# Patient Record
Sex: Female | Born: 1950 | Race: White | Hispanic: No | Marital: Married | Smoking: Never smoker
Health system: Southern US, Community
[De-identification: ages and names within clinical notes are randomized; demographics above are authoritative.]

## PROBLEM LIST (undated history)

## (undated) DIAGNOSIS — M8580 Other specified disorders of bone density and structure, unspecified site: Secondary | ICD-10-CM

## (undated) DIAGNOSIS — J302 Other seasonal allergic rhinitis: Secondary | ICD-10-CM

## (undated) DIAGNOSIS — I10 Essential (primary) hypertension: Secondary | ICD-10-CM

## (undated) DIAGNOSIS — G4730 Sleep apnea, unspecified: Secondary | ICD-10-CM

## (undated) DIAGNOSIS — E039 Hypothyroidism, unspecified: Secondary | ICD-10-CM

## (undated) HISTORY — DX: Other specified disorders of bone density and structure, unspecified site: M85.80

## (undated) HISTORY — DX: Sleep apnea, unspecified: G47.30

## (undated) HISTORY — PX: HEMORRHOID SURGERY: SHX153

## (undated) HISTORY — DX: Essential (primary) hypertension: I10

## (undated) HISTORY — DX: Hypercalcemia: E83.52

## (undated) HISTORY — PX: RETINAL DETACHMENT SURGERY: SHX105

## (undated) HISTORY — DX: Other seasonal allergic rhinitis: J30.2

## (undated) HISTORY — PX: PARATHYROID EXPLORATION: SHX732

## (undated) HISTORY — PX: NASAL SEPTUM SURGERY: SHX37

## (undated) HISTORY — PX: CATARACT EXTRACTION: SUR2

## (undated) HISTORY — DX: Hypothyroidism, unspecified: E03.9

## (undated) HISTORY — PX: TONSILLECTOMY AND ADENOIDECTOMY: SHX28

---

## 2006-12-12 ENCOUNTER — Ambulatory Visit: Payer: Self-pay | Admitting: Internal Medicine

## 2006-12-25 ENCOUNTER — Ambulatory Visit (HOSPITAL_COMMUNITY): Admission: RE | Admit: 2006-12-25 | Discharge: 2006-12-26 | Payer: Self-pay | Admitting: Ophthalmology

## 2008-01-06 DIAGNOSIS — G4733 Obstructive sleep apnea (adult) (pediatric): Secondary | ICD-10-CM | POA: Insufficient documentation

## 2008-01-06 DIAGNOSIS — J4520 Mild intermittent asthma, uncomplicated: Secondary | ICD-10-CM | POA: Insufficient documentation

## 2008-01-06 DIAGNOSIS — J3089 Other allergic rhinitis: Secondary | ICD-10-CM

## 2008-01-06 DIAGNOSIS — J302 Other seasonal allergic rhinitis: Secondary | ICD-10-CM | POA: Insufficient documentation

## 2008-01-07 ENCOUNTER — Ambulatory Visit: Payer: Self-pay | Admitting: Internal Medicine

## 2008-01-14 ENCOUNTER — Encounter: Payer: Self-pay | Admitting: Internal Medicine

## 2008-11-13 ENCOUNTER — Telehealth: Payer: Self-pay | Admitting: Internal Medicine

## 2008-11-17 ENCOUNTER — Encounter: Admission: RE | Admit: 2008-11-17 | Discharge: 2008-11-17 | Payer: Self-pay | Admitting: General Surgery

## 2008-12-30 ENCOUNTER — Ambulatory Visit: Payer: Self-pay | Admitting: Internal Medicine

## 2009-01-08 ENCOUNTER — Telehealth: Payer: Self-pay | Admitting: Internal Medicine

## 2009-01-14 ENCOUNTER — Encounter: Payer: Self-pay | Admitting: Internal Medicine

## 2009-12-29 ENCOUNTER — Ambulatory Visit: Payer: Self-pay | Admitting: Internal Medicine

## 2009-12-29 DIAGNOSIS — E039 Hypothyroidism, unspecified: Secondary | ICD-10-CM | POA: Insufficient documentation

## 2010-01-19 ENCOUNTER — Encounter: Payer: Self-pay | Admitting: Internal Medicine

## 2010-05-11 NOTE — Assessment & Plan Note (Signed)
Summary: rov 1 yr ///kp   Primary Emily Kelley/Referring Lavern Crimi:  Merri Brunette  CC:  1 year follow up visit-sleep; needs RX for CPAP supplies..  History of Present Illness:  01/07/08- Asthma, allergic rhinitis and sleep apnea. 1 yr f/u.  Sleeps well. Nocturia x 1. No day time sleepiness. Can't sleep w/out cpap 10. Declines flu vax. No asthma Nasonex used as needed  12/30/08- Asthma, allergic rhinitis, OSA Continues good compliance and control. She continues at 10 cwp. Needs replacement mask.  Her machine is 62 years old nd still works well. Allergy symptoms increasing this Fall. She has used claritin about once weekly and asks sample Nasonex. Denies cough, wheeze or chest pain. had outgrown childhood asthma and atopy.  December 29, 2009  Asthma, allergic rhinits, OSA Contines CPAP at 10 usd all night every night and she sys she sleeps well, stays wide awake during the day and is doing fine. Allergic nose doing fairly well but asks for refill fluticasone and a sample. Generally doesn't need antihistamine or decongestant as loing as she has this. Denies sinus infections.  Wants Flu vax. Asthma was severe as a child, but denies noticing it in recent years.  Dr Lisabeth Devoid has diagnosed hypothyroidism and goiter, starting treatment.    Asthma History    Initial Asthma Severity Rating:    Age range: 12+ years    Symptoms: 0-2 days/week    Nighttime Awakenings: 0-2/month    Interferes w/ normal activity: no limitations    SABA use (not for EIB): 0-2 days/week    Asthma Severity Assessment: Intermittent   Preventive Screening-Counseling & Management  Alcohol-Tobacco     Smoking Status: never     Passive Smoke Exposure: no  Current Medications (verified): 1)  Hormone Cream .... Apply Daily 2)  Cpap 10 Cwp .... [facility] 3)  Fluticasone Propionate 50 Mcg/act Susp (Fluticasone Propionate) .Marland Kitchen.. 1-2 Puffs Each Nostril Once Every Day 4)  Armour Thyroid 15 Mg Tabs  (Thyroid) .... Take 1 By Mouth Once Daily  Allergies (verified): No Known Drug Allergies  Past History:  Family History: Last updated: 12/30/2008 Mother-living age 49; DM, dementia Father- deceased age 50; Parkinson's disease Sibling 1- living age 69  Social History: Last updated: 01/24/2008 Patient never smoked.  Negative history of passive tobacco smoke exposure.  No ETOH Married with no children   Risk Factors: Smoking Status: never (12/29/2009) Passive Smoke Exposure: no (12/29/2009)  Past Medical History: SLEEP APNEA (ICD-780.57) NPSG 01/14/99 AHI 14 ASTHMA, CHILDHOOD (ICD-493.00) ALLERGIC RHINITIS, SEASONAL (ICD-477.0) Hypothyroidism  Past Surgical History: Tonsils and & Adenoids Deviated septum repair in 1973 Hemorhoid surgery x 2 polyp  Review of Systems      See HPI  The patient denies shortness of breath with activity, shortness of breath at rest, productive cough, non-productive cough, coughing up blood, chest pain, irregular heartbeats, acid heartburn, indigestion, loss of appetite, weight change, abdominal pain, difficulty swallowing, sore throat, tooth/dental problems, headaches, nasal congestion/difficulty breathing through nose, and sneezing.    Vital Signs:  Patient profile:   60 year old female Height:      68 inches Weight:      194.13 pounds BMI:     29.62 O2 Sat:      96 % on Room air Pulse rate:   83 / minute BP sitting:   144 / 86  (right arm) Cuff size:   regular  Vitals Entered By: Reynaldo Minium CMA (December 29, 2009 9:04 AM)  O2 Flow:  Room  air CC: 1 year follow up visit-sleep; needs RX for CPAP supplies.   Physical Exam  Additional Exam:  General: A/Ox3; pleasant and cooperative, NAD, talkative SKIN: no rash, lesions NODES: no lymphadenopathy HEENT: [place]/AT, EOM- WNL, Conjuctivae- clear. bags under eyes, PERRLA, TM-WNL, Nose- stuffy, especially on the right, septal deviation, Throat- clear and wnl, Mallampati III NECK: Supple w/  fair ROM, JVD- none, normal carotid impulses w/o bruits Thyroid- ? small goiter CHEST: Clear to P&A HEART: RRR, no m/g/r heard ABDOMEN: Soft and nl;  ZOX:WRUE, nl pulses, no edema  NEURO: Grossly intact to observation       Impression & Recommendations:  Problem # 1:  SLEEP APNEA (ICD-780.57)  Doing  very well with good compliance and control on cpap at 10. We are scripting for replacement cpaqp supplies.  Orders: Est. Patient Level IV (45409) DME Referral (DME)  Problem # 2:  ALLERGIC RHINITIS, SEASONAL (ICD-477.0)  She is getting more congested with the seasonal pollens since she has't been using her nasal spray. We will get that refilled and we did discuss otc options.  Orders: Est. Patient Level IV (81191)  Problem # 3:  ASTHMA, CHILDHOOD (ICD-493.00) Mild asthma has not been clinically evident in a long time. We discussed the potential for reactivation. We are giveing flu vax as discussed.  Medications Added to Medication List This Visit: 1)  Replacement Cpap Supplies and Mask of [facility]  2)  Armour Thyroid 15 Mg Tabs (Thyroid) .... Take 1 by mouth once daily  Patient Instructions: 1)  Please schedule a follow-up appointment in 1 year. 2)  We will send script to DME for replacement mask and supplies, with pressure left at 10 3)  Sample nasal steroid spray with refill script for fluticasone 4)  Flu vax Prescriptions: FLUTICASONE PROPIONATE 50 MCG/ACT SUSP (FLUTICASONE PROPIONATE) 1-2 puffs each nostril once every day  #1 x prn   Entered and Authorized by:   Waymon Budge MD   Signed by:   Waymon Budge MD on 12/29/2009   Method used:   Print then Give to Patient   RxID:   [id number] REPLACEMENT CPAP SUPPLIES AND MASK OF CHOICE   #1 x prn   Entered and Authorized by:   Waymon Budge MD   Signed by:   Waymon Budge MD on 12/29/2009   Method used:   Print then Give to Patient   RxID:   [id number]

## 2010-05-11 NOTE — Medication Information (Signed)
Summary: CPAP Supplies / [facility]  CPAP Supplies / [facility]   Imported By: Lennie Odor 01/31/2010 15:03:59  _____________________________________________________________________  External Attachment:    Type:   Image     Comment:   External Document

## 2010-08-23 NOTE — Op Note (Signed)
NAMETHERESIA, PREE NO.:  [id number]   MEDICAL RECORD NO.:  [id number]          PATIENT TYPE:  OIB   LOCATION:  5706                         FACILITY:  [facility]   PHYSICIAN:  Beulah Gandy. Ashley Royalty, M.D. DATE OF BIRTH:  [birth year]   DATE OF PROCEDURE:  12/25/2006  DATE OF DISCHARGE:                               OPERATIVE REPORT   ADMISSION DIAGNOSIS:  Rhegmatogenous retinal detachment, left eye.   PROCEDURE:  Scleral buckle, left eye, retinal photocoagulation, left  eye.   SURGEON:  Beulah Gandy. Ashley Royalty, M.D.   ASSISTANT:  Rosalie Doctor, MA   ANESTHESIA:  General.   DETAIL:  Usual prep and drape, 360 degrees limbal peritomy, isolation of  four rectus muscles on 2-0 silk.  Localization of breaks at 4 o'clock.  Scleral dissection from 2 o'clock to 10 o'clock to admit a number 279  intrascleral implant.  Diathermy placed in the bed.  Additional  dissection was carried out to 4 o'clock beneath the breaks.  Two sutures  per quadrant for a total of six sutures were placed in the scleral  flaps. A 279 implant was placed against the globe in the bed.  A 508G  radial segment was fashioned to fit beneath the breaks at 4 o'clock.  Perforation site at 4 o'clock with a small amount of clear colorless  subretinal fluid which was sticky.  The buckle elements were placed and  the scleral flaps were closed.  Indirect ophthalmoscopy showed the  retina to be lying nicely on the scleral buckle with the break well  supported.  A 240 band was placed around the eye with a belt loop at 10  and 2 and a 270 sleeve at 12 o'clock.  The buckle was adjusted and  trimmed.  The band was adjusted and trimmed.  Indirect ophthalmoscope  laser was moved into place, 550 burns were placed around the retinal  periphery with a power between 400 and 600 milliwatts, 1000 microns  each, and 0.1 seconds each. The conjunctiva was reposited with 7-0  chromic suture.  Polymyxin and gentamicin were irrigated into  tenon's  space.  Atropine solution was applied. Marcaine was injected around the  globe for postop pain. Decadron 10 mg was injected into the lower  subconjunctival space. Paracentesis x2 obtained a closing pressure of 15  with a Banker.  TobraDex ointment, a patch and shield were  placed.  The patient is awakened and taken to recovery in satisfactory  condition.  Complications none.  Duration 1 hour 30 minutes.      Beulah Gandy. Ashley Royalty, M.D.  Electronically Signed     JDM/MEDQ  D:  12/25/2006  T:  12/25/2006  Job:  [id number]

## 2010-08-23 NOTE — Assessment & Plan Note (Signed)
[facility]                             PULMONARY OFFICE NOTE   Emily Kelley, Emily Kelley                    MRN:          [id number]  DATE:12/12/2006                            DOB:          [birth year]    PROBLEM:  A 60 year old woman with sleep apnea seeking to establish here  for followup.   HISTORY:  She was diagnosed in [place] 8 years ago because of husband's  complaints of her interrupted snoring and her own awareness of daytime  sleepiness.  She had had an accident in 2000, falling at work, and was  found to be hypotensive which they attributed to her sleep apnea.  A  nocturnal polysomnogram on January 14, 1999 recorded an apnea/hypopnea  index of 14.95 per hour with desaturation nadir 86%.  Events were not  clearly positional.  She also had periodic limb movement with arousal  about 8.4 times per hour.  She was titrated to CPAP at 10 CWP and has  done very well with this.  Since they moved here from [place], she has  spoken with [facility], but not established with a home care company.  She  has a GYN, but has not established a primary physician otherwise.  She  is told now that she does not snore through CPAP and she does not feel  tired.  Bedtime 10 to 11 p.m., estimating sleep latency 5 minutes,  waking zero to 1 before finally up at 6 a.m.   MEDICATIONS:  1. Hormones.  2. Fish oil.  3. Co-Q 10.   No medication allergy.   REVIEW OF SYSTEMS:  No snoring with CPAP.  Weight has gone up about 10  pounds.  She is sedentary.  She gets somewhat dyspneic climbing stairs,  but thinks this is related to her level of her fitness.  No chest pain  or palpitations, syncope, or wheeze.   PAST HISTORY:  1. Tonsils and adenoids out.  2. No history of thyroid disease.  3. Asthma only as a child.  4. Seasonal rhinitis.  She was skin test positive as a child.  5. Sleep apnea.  6. Deviated septum repair in 1973.  7. Hemorrhoid surgery x2.   SOCIAL  HISTORY:  Nonsmoker, no alcohol.  Two cups of coffee in the  morning, sometimes 1 glass of tea later.  Married, no children.  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   FAMILY HISTORY:  Nobody known to have sleep apnea, heart disease, or  stroke.  Mother is living at age 21.   OBJECTIVE:  Weight 211 pounds, BP 140/86, pulse 76, room air saturation  94%.  She looks somewhat tired, a little puffy under the eyes.  No  conjunctival injection.  Speech is clear, affect is pleasant.  Neurologic is unremarkable with observation.  HEENT:  Palate spacing 3-4/4, voice quality normal.  No postnasal  drainage.  No evident nasal obstruction, no thyromegaly.  CHEST:  Quiet, clear lung fields.  Unlabored breathing.  Heart sounds regular without murmur or gallop.  EXTREMITIES:  Without restlessness or tremor.   IMPRESSION:  1. Obstructive sleep apnea with a original index 14.9 per hour on      January 14, 1999.  Titrated to continuous positive airway pressure      of 10 CWP.  2. Allergic rhinitis.   PLAN:  1. We discussed the basics of sleep apnea.  2. Will work with her to get replacement mask and supplies as needed.      She can continue continuous positive airway pressure at 10.  She is      given a sample of Nasonex 2 sprays each nostril daily until the      sample is used up for trial.  3. Schedule return 2-3 months, earlier p.r.n.     Clinton D. Maple Hudson, MD, Tonny Bollman, FACP  Electronically Signed    CDY/MedQ  DD: 12/12/2006  DT: 12/13/2006  Job #: [id number]   cc:   Duke Salvia. Marcelle Overlie, M.D.

## 2010-12-28 ENCOUNTER — Encounter: Payer: Self-pay | Admitting: Pulmonary Disease

## 2010-12-29 ENCOUNTER — Ambulatory Visit (INDEPENDENT_AMBULATORY_CARE_PROVIDER_SITE_OTHER): Payer: 59 | Admitting: Internal Medicine

## 2010-12-29 ENCOUNTER — Encounter: Payer: Self-pay | Admitting: Internal Medicine

## 2010-12-29 VITALS — BP 140/80 | HR 74 | Ht 68.0 in | Wt 199.2 lb

## 2010-12-29 DIAGNOSIS — J301 Allergic rhinitis due to pollen: Secondary | ICD-10-CM

## 2010-12-29 DIAGNOSIS — G4730 Sleep apnea, unspecified: Secondary | ICD-10-CM

## 2010-12-29 NOTE — Patient Instructions (Signed)
Order- [facility]- replacement CPAP mask and supplies   Dx OSA  Please call as needed. Call for Nasonex script if needed for nose problems lasting more than a day or two.

## 2010-12-29 NOTE — Assessment & Plan Note (Signed)
Great compliance and control. We will refill her supplies as requested.

## 2010-12-29 NOTE — Assessment & Plan Note (Signed)
Educated on differences between Nasonex and Afrin. We wil leave Nasonex on her list as available if needed.

## 2010-12-29 NOTE — Progress Notes (Signed)
Subjective:    Patient ID: Emily Kelley, female    DOB: [birth year], 60 y.o.   MRN: [id number]  HPI 12/29/10- 59yoF followed for asthma, allergic rhinitis, OSA, complicated by hypothyroidism. Last here December 19, 2009.  Since last here she says she has been doing "great". She continues CPAP through [facility] at 10 CWP using a nasal pillows mask. She asks for a prescription for replacement of mask and supplies. Her machine mechanically works well. She uses it all night every night  " cannot live without it". She has not had recognizable asthma since childhood. Denies nasal congestion sneezing or drainage. Recognizes minor watering of her eyes occasionally and can manage this with over-the-counter antihistamines. We're need for over-the-counter decongestant which we discussed. She did not need and did not fill prescription for Nasonex last year. She was unclear about the distinction between nasal decongestant sprays and nasal steroid sprays so we spent time discussing that.   Review of Systems Constitutional:   No-   weight loss, night sweats, fevers, chills, fatigue, lassitude. HEENT:   No-  headaches, difficulty swallowing, tooth/dental problems, sore throat,       No-  sneezing, itching, ear ache, nasal congestion, post nasal drip,  CV:  No-   chest pain, orthopnea, PND, swelling in lower extremities, anasarca,  dizziness, palpitations Resp: No-   shortness of breath with exertion or at rest.              No-   productive cough,  No non-productive cough,  No-  coughing up of blood.              No-   change in color of mucus.  No- wheezing.   Skin: No-   rash or lesions. GI:  No-   heartburn, indigestion, abdominal pain, nausea, vomiting, diarrhea,                 change in bowel habits, loss of appetite GU: No-   dysuria, change in color of urine, no urgency or frequency.  No- flank pain. MS:  No-   joint pain or swelling.  No- decreased range of motion.  No- back pain.  Psych:   No- change in mood or affect. No depression or anxiety.  No memory loss.      Objective:   Physical Exam General- Alert, Oriented, Affect-appropriate, Distress- none acute Skin- rash-none, lesions- none, excoriation- none Lymphadenopathy- none Head- atraumatic            Eyes- Gross vision intact, PERRLA, conjunctivae clear secretions.+ Mild bilateral periorbitally edema.            Ears- Hearing, canals normal            Nose- Clear, no-Septal dev, mucus, polyps, erosion, perforation             Throat- Mallampati III-IV , mucosa clear , drainage- none, tonsils- atrophic Neck- flexible , trachea midline, no stridor , thyroid nl, carotid no bruit Chest - symmetrical excursion , unlabored           Heart/CV- RRR , no murmur , no gallop  , no rub, nl s1 s2                           - JVD- none , edema- none, stasis changes- none, varices- none           Lung- clear to P&A, wheeze- none, cough- none , dullness-none,  rub- none           Chest wall-  Abd- tender-no, distended-no, bowel sounds-present, HSM- no Br/ Gen/ Rectal- Not done, not indicated Extrem- cyanosis- none, clubbing, none, atrophy- none, strength- nl Neuro- grossly intact to observation          Assessment & Plan:

## 2011-01-19 LAB — CBC
HCT: 45.3
Hemoglobin: 15.7 — ABNORMAL HIGH
RBC: 5.13 — ABNORMAL HIGH
RDW: 13.1

## 2011-01-19 LAB — BASIC METABOLIC PANEL
CO2: 27
Calcium: 10.7 — ABNORMAL HIGH
GFR calc Af Amer: >60
GFR calc non Af Amer: >60
Glucose, Bld: 99
Potassium: 3.9
Sodium: 140

## 2011-01-30 ENCOUNTER — Telehealth: Payer: Self-pay | Admitting: Internal Medicine

## 2011-01-30 DIAGNOSIS — G4730 Sleep apnea, unspecified: Secondary | ICD-10-CM
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
## 2011-12-28 ENCOUNTER — Ambulatory Visit (INDEPENDENT_AMBULATORY_CARE_PROVIDER_SITE_OTHER): Payer: 59 | Admitting: Internal Medicine

## 2011-12-28 ENCOUNTER — Encounter: Payer: Self-pay | Admitting: Internal Medicine

## 2011-12-28 VITALS — BP 126/76 | HR 75 | Ht 67.5 in | Wt 190.0 lb

## 2011-12-28 DIAGNOSIS — G4733 Obstructive sleep apnea (adult) (pediatric): Secondary | ICD-10-CM

## 2011-12-28 DIAGNOSIS — J301 Allergic rhinitis due to pollen: Secondary | ICD-10-CM

## 2011-12-28 MED ORDER — AZELASTINE-FLUTICASONE 137-50 MCG/ACT NA SUSP: 2.0000 | Freq: Every day | NASAL | Status: DC

## 2011-12-28 NOTE — Progress Notes (Signed)
Subjective:    Patient ID: Emily Kelley, female    DOB: 10-Jul-1950, 61 y.o.   MRN: [id number]  HPI 12/29/10- 59yoF followed for asthma, allergic rhinitis, OSA, complicated by hypothyroidism. Last here December 19, 2009.  Since last here she says she has been doing "great". She continues CPAP through [facility] at 10 CWP using a nasal pillows mask. She asks for a prescription for replacement of mask and supplies. Her machine mechanically works well. She uses it all night every night  " cannot live without it". She has not had recognizable asthma since childhood. Denies nasal congestion sneezing or drainage. Recognizes minor watering of her eyes occasionally and can manage this with over-the-counter antihistamines. We're need for over-the-counter decongestant which we discussed. She did not need and did not fill prescription for Nasonex last year. She was unclear about the distinction between nasal decongestant sprays and nasal steroid sprays so we spent time discussing that.  12/28/11- 60 yoF followed for asthma, allergic rhinitis, OSA, complicated by hypothyroidism. Wears CPAP 10/ [facility] 8 Hrs. avg at hs, pressure. good, doing well, needs new mask, etc. Allergic rhinitis has been fairly well controlled. Sporadic use of Nasonex seasonally. Declines flu vaccine  Review of Systems- see HPI Constitutional:   No-   weight loss, night sweats, fevers, chills, fatigue, lassitude. HEENT:   No-  headaches, difficulty swallowing, tooth/dental problems, sore throat,       + sneezing, itching, ear ache, nasal congestion, post nasal drip,  CV:  No-   chest pain, orthopnea, PND, swelling in lower extremities, anasarca,  dizziness, palpitations Resp: No-   shortness of breath with exertion or at rest.              No-   productive cough,  No non-productive cough,  No-  coughing up of blood.              No-   change in color of mucus.  No- wheezing.   Skin: No-   rash or lesions. GI:  No-    heartburn, indigestion, abdominal pain, nausea, vomiting,  GU:  MS:  No-   joint pain or swelling. Marland Kitchen Psych:  No- change in mood or affect. No depression or anxiety.  No memory loss.   Objective:   Physical Exam General- Alert, Oriented, Affect-appropriate, Distress- none acute Skin- rash-none, lesions- none, excoriation- none Lymphadenopathy- none Head- atraumatic            Eyes- Gross vision intact, PERRLA, conjunctivae clear secretions.+ Mild bilateral periorbital edema.            Ears- Hearing, canals normal            Nose- + turbinate edema, no-Septal dev, mucus, polyps, erosion, perforation             Throat- Mallampati III-IV , mucosa clear , drainage- none, tonsils- atrophic Neck- flexible , trachea midline, no stridor , thyroid nl, carotid no bruit Chest - symmetrical excursion , unlabored           Heart/CV- RRR , no murmur , no gallop  , no rub, nl s1 s2                           - JVD- none , edema- none, stasis changes- none, varices- none           Lung- clear to P&A, wheeze- none, cough- none , dullness-none, rub- none  Chest wall-  Abd-  Br/ Gen/ Rectal- Not done, not indicated Extrem- cyanosis- none, clubbing, none, atrophy- none, strength- nl Neuro- grossly intact to observation    Assessment & Plan:

## 2011-12-28 NOTE — Patient Instructions (Addendum)
Order- [facility]   Repalcement CPAP mask of choice and supplies      Dx OSA  Sample Dymista nasal spray    1-2 puffs each nostril, once every night at bedtime.  Please call as needed

## 2012-01-07 NOTE — Assessment & Plan Note (Signed)
Good compliance and control. Needs replacement mask and supplies.

## 2012-01-07 NOTE — Assessment & Plan Note (Signed)
Generally good control. I discussed maintenance versus rescue use of Nasonex. Plan- let her try a sample Dymista for comparison.

## 2012-06-27 ENCOUNTER — Other Ambulatory Visit: Payer: Self-pay | Admitting: Endocrinology

## 2012-06-27 DIAGNOSIS — E049 Nontoxic goiter, unspecified: Secondary | ICD-10-CM

## 2012-07-04 ENCOUNTER — Ambulatory Visit
Admission: RE | Admit: 2012-07-04 | Discharge: 2012-07-04 | Disposition: A | Discharge status: Home / Self Care | Payer: 59 | Source: Ambulatory Visit | Attending: Endocrinology | Admitting: Endocrinology

## 2012-07-04 DIAGNOSIS — E049 Nontoxic goiter, unspecified: Secondary | ICD-10-CM

## 2012-07-18 ENCOUNTER — Other Ambulatory Visit: Payer: Self-pay | Admitting: Internal Medicine

## 2012-07-18 DIAGNOSIS — E042 Nontoxic multinodular goiter: Secondary | ICD-10-CM

## 2012-08-21 ENCOUNTER — Ambulatory Visit
Admission: RE | Admit: 2012-08-21 | Discharge: 2012-08-21 | Disposition: A | Discharge status: Home / Self Care | Payer: 59 | Source: Ambulatory Visit | Attending: Internal Medicine | Admitting: Internal Medicine

## 2012-08-21 ENCOUNTER — Other Ambulatory Visit (HOSPITAL_COMMUNITY)
Admission: RE | Admit: 2012-08-21 | Discharge: 2012-08-21 | Disposition: A | Discharge status: Home / Self Care | Payer: 59 | Source: Ambulatory Visit | Attending: Interventional Radiology | Admitting: Interventional Radiology

## 2012-08-21 DIAGNOSIS — E042 Nontoxic multinodular goiter: Secondary | ICD-10-CM

## 2012-08-21 DIAGNOSIS — E049 Nontoxic goiter, unspecified: Secondary | ICD-10-CM | POA: Insufficient documentation

## 2012-12-27 ENCOUNTER — Ambulatory Visit (INDEPENDENT_AMBULATORY_CARE_PROVIDER_SITE_OTHER): Payer: 59 | Admitting: Internal Medicine

## 2012-12-27 ENCOUNTER — Encounter: Payer: Self-pay | Admitting: Internal Medicine

## 2012-12-27 VITALS — BP 128/76 | HR 85 | Ht 67.0 in | Wt 196.4 lb

## 2012-12-27 DIAGNOSIS — J309 Allergic rhinitis, unspecified: Secondary | ICD-10-CM

## 2012-12-27 DIAGNOSIS — J302 Other seasonal allergic rhinitis: Secondary | ICD-10-CM

## 2012-12-27 DIAGNOSIS — G4733 Obstructive sleep apnea (adult) (pediatric): Secondary | ICD-10-CM

## 2012-12-27 MED ORDER — AZELASTINE-FLUTICASONE 137-50 MCG/ACT NA SUSP: 2.0000 | Freq: Every day | NASAL | Status: DC

## 2012-12-27 NOTE — Patient Instructions (Addendum)
Order- [facility] replacement mask of choice and supplies        Dx OSA  Sample and script Dymista nasal spray   1-2 puffs each nostril once daily at bedtime  Please call as needed

## 2012-12-27 NOTE — Assessment & Plan Note (Signed)
Plan- ok to continue Dymista after discussion

## 2012-12-27 NOTE — Assessment & Plan Note (Signed)
Good compliance and control Needs script to get new CPAP supplies

## 2012-12-27 NOTE — Progress Notes (Signed)
Subjective:    Patient ID: Emily Kelley, female    DOB: October 31, 1950, 62 y.o.   MRN: [id number]  HPI 12/29/10- 59yoF followed for asthma, allergic rhinitis, OSA, complicated by hypothyroidism. Last here December 19, 2009.  Since last here she says she has been doing "great". She continues CPAP through [facility] at 10 CWP using a nasal pillows mask. She asks for a prescription for replacement of mask and supplies. Her machine mechanically works well. She uses it all night every night  " cannot live without it". She has not had recognizable asthma since childhood. Denies nasal congestion sneezing or drainage. Recognizes minor watering of her eyes occasionally and can manage this with over-the-counter antihistamines. We're need for over-the-counter decongestant which we discussed. She did not need and did not fill prescription for Nasonex last year. She was unclear about the distinction between nasal decongestant sprays and nasal steroid sprays so we spent time discussing that.  12/28/11- 60 yoF followed for asthma, allergic rhinitis, OSA, complicated by hypothyroidism. Wears CPAP 10/ [facility] 8 Hrs. avg at hs, pressure. good, doing well, needs new mask, etc. Allergic rhinitis has been fairly well controlled. Sporadic use of Nasonex seasonally. Declines flu vaccine  12/27/12- 61 yoF followed for asthma, allergic rhinitis, OSA, complicated by hypothyroidism. FOLLOWS FOR: wears CPAP 10/ [facility] every night for about 8 hours; pressure working well for patient; due for new supplies to [facility]. Sleeping well, but can't sleep w/o CPAP. She declines flu vax since no problems in years- discussed. Prefers Dymista over Nasonex.  Review of Systems- see HPI Constitutional:   No-   weight loss, night sweats, fevers, chills, fatigue, lassitude. HEENT:   No-  headaches, difficulty swallowing, tooth/dental problems, sore throat,       + sneezing, itching, ear ache, +nasal congestion, post nasal  drip,  CV:  No-   chest pain, orthopnea, PND, swelling in lower extremities, anasarca,  dizziness, palpitations Resp: No-   shortness of breath with exertion or at rest.              No-   productive cough,  No non-productive cough,  No-  coughing up of blood.              No-   change in color of mucus.  No- wheezing.   Skin: No-   rash or lesions. GI:  No-   heartburn, indigestion, abdominal pain, nausea, vomiting,  GU:  MS:  No-   joint pain or swelling. Marland Kitchen Psych:  No- change in mood or affect. No depression or anxiety.  No memory loss.   Objective:   Physical Exam- exam similar. General- Alert, Oriented, Affect-appropriate, Distress- none acute Skin- rash-none, lesions- none, excoriation- none Lymphadenopathy- none Head- atraumatic            Eyes- Gross vision intact, PERRLA, conjunctivae clear secretions.+ Mild bilateral periorbital edema.            Ears- Hearing, canals normal            Nose- + turbinate edema, no-Septal dev, mucus, polyps, erosion, perforation             Throat- Mallampati III-IV , mucosa clear , drainage- none, tonsils- atrophic Neck- flexible , trachea midline, no stridor , thyroid nl, carotid no bruit Chest - symmetrical excursion , unlabored           Heart/CV- RRR , no murmur , no gallop  , no rub, nl s1 s2                           -  JVD- none , edema- none, stasis changes- none, varices- none           Lung- clear to P&A, wheeze- none, cough- none , dullness-none, rub- none           Chest wall-  Abd-  Br/ Gen/ Rectal- Not done, not indicated Extrem- cyanosis- none, clubbing, none, atrophy- none, strength- nl Neuro- grossly intact to observation    Assessment & Plan:

## 2013-01-02 ENCOUNTER — Ambulatory Visit: Payer: 59 | Admitting: Internal Medicine

## 2013-01-26 ENCOUNTER — Encounter (HOSPITAL_COMMUNITY): Payer: Self-pay | Admitting: Emergency Medicine

## 2013-01-26 ENCOUNTER — Emergency Department (INDEPENDENT_AMBULATORY_CARE_PROVIDER_SITE_OTHER)
Admission: EM | Admit: 2013-01-26 | Discharge: 2013-01-26 | Disposition: A | Discharge status: Home / Self Care | Payer: 59 | Source: Home / Self Care | Attending: Emergency Medicine | Admitting: Emergency Medicine

## 2013-01-26 DIAGNOSIS — L255 Unspecified contact dermatitis due to plants, except food: Secondary | ICD-10-CM

## 2013-01-26 DIAGNOSIS — L237 Allergic contact dermatitis due to plants, except food: Secondary | ICD-10-CM

## 2013-01-26 MED ORDER — METHYLPREDNISOLONE ACETATE 80 MG/ML IJ SUSP
80.0000 mg | Freq: Once | INTRAMUSCULAR | Status: AC
  Administered 2013-01-26: 80 mg via INTRAMUSCULAR

## 2013-01-26 MED ORDER — METHYLPREDNISOLONE ACETATE 80 MG/ML IJ SUSP
INTRAMUSCULAR | Status: AC
  Filled 2013-01-26: qty 1

## 2013-01-26 MED ORDER — TRIAMCINOLONE ACETONIDE 0.1 % EX CREA: TOPICAL_CREAM | Freq: Three times a day (TID) | CUTANEOUS | Status: DC

## 2013-01-26 MED ORDER — PREDNISONE 20 MG PO TABS: ORAL_TABLET | ORAL | Status: DC

## 2013-01-26 MED ORDER — HYDROXYZINE HCL 25 MG PO TABS: 25.0000 mg | ORAL_TABLET | Freq: Four times a day (QID) | ORAL | Status: DC | PRN

## 2013-01-26 NOTE — ED Notes (Signed)
Reports exposure to poison ivy 2 wks ago; approx 3 days later started with poison ivy rash.  Rash to BUE and torso has not improved, and has possibly gotten worse.  Has been taking 25mg  Benadryl, has tried using Benadryl gel, Sarna lotion, medicated powder, and a topical spray without any relief or improvement.

## 2013-01-26 NOTE — ED Provider Notes (Signed)
Chief Complaint:   Chief Complaint  Patient presents with  . Poison Ivy    History of Present Illness:   Emily Kelley is a 62 year old female who has had a 2-1/2 week history of a poison ivy rash. She was exposed in her yard. She has lesions on her face, trunk, arms, not much in her legs. The areas are extensive, erythematous, itching burning, blistering, and oozing. She has not had any difficulty breathing, or swelling of lips, tongue, or throat. She has had similar severe reactions to poison ivy in the past.  Review of Systems:  Other than noted above, the patient denies any of the following symptoms: Systemic:  No fever, chills, sweats, weight loss, or fatigue. ENT:  No nasal congestion, rhinorrhea, sore throat, swelling of lips, tongue or throat. Resp:  No cough, wheezing, or shortness of breath. Skin:  No rash, itching, nodules, or suspicious lesions.  PMFSH:  Past medical history, family history, social history, meds, and allergies were reviewed. The patient takes Armour Thyroid and bioidentical hormones.  Physical Exam:   Vital signs:  BP 170/94  Pulse 89  Temp(Src) 97.6 F (36.4 C) (Oral)  Resp 16  SpO2 98% Gen:  Alert, oriented, in no distress. ENT:  Pharynx clear, no intraoral lesions, moist mucous membranes. Lungs:  Clear to auscultation. Skin:  She has a severe poison ivy rash with erythema, scaling, blistering, and oozing. This involves her neck, upper chest, abdomen, and arms.  Course in Urgent Care Center:   Given Depo-Medrol 80 mg IM.  Assessment:  The encounter diagnosis was Poison ivy.  Plan:   1.  Meds:  The following meds were prescribed:   Discharge Medication List as of 01/26/2013 12:55 PM    START taking these medications   Details  hydrOXYzine (ATARAX/VISTARIL) 25 MG tablet Take 1 tablet (25 mg total) by mouth every 6 (six) hours as needed for itching., Starting 01/26/2013, Until Discontinued, Normal    predniSONE (DELTASONE) 20 MG tablet Take 3  daily for 5 days, 2 daily for 5 days, 1 daily for 5 days., Normal    triamcinolone cream (KENALOG) 0.1 % Apply topically 3 (three) times daily., Starting 01/26/2013, Until Discontinued, Normal        2.  Patient Education/Counseling:  The patient was given appropriate handouts, self care instructions, and instructed in symptomatic relief.   3.  Follow up:  The patient was told to follow up if no better in 3 to 4 days, if becoming worse in any way, and given some red flag symptoms such as any difficulty breathing which would prompt immediate return.  Follow up here as necessary.      Reuben Likes, MD 01/26/13 1341

## 2013-04-14 ENCOUNTER — Encounter (HOSPITAL_COMMUNITY): Payer: Self-pay | Admitting: Emergency Medicine

## 2013-04-14 ENCOUNTER — Emergency Department (INDEPENDENT_AMBULATORY_CARE_PROVIDER_SITE_OTHER)
Admission: EM | Admit: 2013-04-14 | Discharge: 2013-04-14 | Disposition: A | Discharge status: Home / Self Care | Payer: 59 | Source: Home / Self Care | Attending: Emergency Medicine | Admitting: Emergency Medicine

## 2013-04-14 ENCOUNTER — Emergency Department (INDEPENDENT_AMBULATORY_CARE_PROVIDER_SITE_OTHER): Payer: 59

## 2013-04-14 DIAGNOSIS — H1033 Unspecified acute conjunctivitis, bilateral: Secondary | ICD-10-CM

## 2013-04-14 DIAGNOSIS — J329 Chronic sinusitis, unspecified: Secondary | ICD-10-CM

## 2013-04-14 DIAGNOSIS — H1030 Unspecified acute conjunctivitis, unspecified eye: Secondary | ICD-10-CM

## 2013-04-14 MED ORDER — HYDROCOD POLST-CHLORPHEN POLST 10-8 MG/5ML PO LQCR: 5.0000 mL | Freq: Two times a day (BID) | ORAL | Status: DC

## 2013-04-14 MED ORDER — AMOXICILLIN-POT CLAVULANATE 875-125 MG PO TABS: 1.0000 | ORAL_TABLET | Freq: Two times a day (BID) | ORAL | Status: DC

## 2013-04-14 MED ORDER — TOBRAMYCIN 0.3 % OP SOLN: 2.0000 [drp] | OPHTHALMIC | Status: DC

## 2013-04-14 NOTE — ED Provider Notes (Signed)
CSN: [id number]     Arrival date & time 04/14/13  1320 History   First MD Initiated Contact with Patient 04/14/13 1502     Chief Complaint  Patient presents with  . URI   (Consider location/radiation/quality/duration/timing/severity/associated sxs/prior Treatment) Patient is a 63 y.o. female presenting with cough. The history is provided by the patient. No language interpreter was used.  Cough Cough characteristics:  Productive Severity:  Moderate Onset quality:  Gradual Duration:  1 week Timing:  Constant Progression:  Worsening Chronicity:  New Smoker: no   Context: sick contacts   Relieved by:  Nothing Worsened by:  Nothing tried Ineffective treatments:  None tried Associated symptoms: ear pain, eye discharge, rhinorrhea, sinus congestion and sore throat     Past Medical History  Diagnosis Date  . Sleep apnea   . Asthma     CHILDHOOD  . Allergic rhinitis, seasonal   . Hypothyroidism    Past Surgical History  Procedure Laterality Date  . Tonsillectomy and adenoidectomy    . Nasal septum surgery    . Hemorrhoid surgery      x2 polyp   Family History  Problem Relation Age of Onset  . Dementia Mother   . Other Father     parkinson's disease   History  Substance Use Topics  . Smoking status: Never Smoker   . Smokeless tobacco: Not on file  . Alcohol Use: No   OB History   Grav Para Term Preterm Abortions TAB SAB Ect Mult Living                 Review of Systems  HENT: Positive for ear pain, rhinorrhea and sore throat.   Eyes: Positive for discharge.  Respiratory: Positive for cough.   All other systems reviewed and are negative.    Allergies  Review of patient's allergies indicates no known allergies.  Home Medications   Current Outpatient Rx  Name  Route  Sig  Dispense  Refill  . Azelastine-Fluticasone (DYMISTA) 137-50 MCG/ACT SUSP   Nasal   Place 2 puffs into the nose at bedtime.   1 Bottle   prn   . Hormone Cream Base CREA   Does not  apply   by Does not apply route daily.          . hydrOXYzine (ATARAX/VISTARIL) 25 MG tablet   Oral   Take 1 tablet (25 mg total) by mouth every 6 (six) hours as needed for itching.   30 tablet   0   . Multiple Vitamin (MULTIVITAMIN) tablet   Oral   Take 1 tablet by mouth daily.           . Omega-3 Fatty Acids (FISH OIL) 1000 MG CAPS   Oral   Take 1 capsule by mouth daily.           . predniSONE (DELTASONE) 20 MG tablet      Take 3 daily for 5 days, 2 daily for 5 days, 1 daily for 5 days.   30 tablet   0   . thyroid (ARMOUR) 60 MG tablet   Oral   Take 60 mg by mouth daily.         Marland Kitchen triamcinolone cream (KENALOG) 0.1 %   Topical   Apply topically 3 (three) times daily.   454 g   2    BP 171/76  Pulse 107  Temp(Src) 98.3 F (36.8 C) (Oral)  Resp 16  SpO2 97% Physical Exam  Nursing note and  vitals reviewed. Constitutional: She is oriented to person, place, and time. She appears well-developed and well-nourished.  HENT:  Head: Normocephalic and atraumatic.  Right Ear: External ear normal.  Nose: Nose normal.  Mouth/Throat: Oropharynx is clear and moist.  Erythema throat  Eyes: Right eye exhibits discharge. Left eye exhibits discharge.  Neck: Normal range of motion. Neck supple.  Cardiovascular: Normal rate and normal heart sounds.   Pulmonary/Chest: Effort normal.  Abdominal: Soft.  Musculoskeletal: Normal range of motion.  Neurological: She is alert and oriented to person, place, and time. She has normal reflexes.  Skin: Skin is warm.  Psychiatric: She has a normal mood and affect.    ED Course  Procedures (including critical care time) Labs Review Labs Reviewed - No data to display Imaging Review No results found.  EKG Interpretation    Date/Time:    Ventricular Rate:    PR Interval:    QRS Duration:   QT Interval:    QTC Calculation:   R Axis:     Text Interpretation:             .edthis MDM   1. Conjunctivitis, acute,  bilateral   2. Sinusitis    tobrex opth  Solution. augmentin 875   See your MD for recheck in 2-3 days   Fransico Meadow, [place] 04/14/13 1646

## 2013-04-14 NOTE — ED Notes (Signed)
C/o uri, onset 8 days ago.  Cough, head congestion and headache, productive cough, eyes draining discolored secretions and has a sore throat.

## 2013-04-14 NOTE — Discharge Instructions (Signed)
Conjunctivitis °Conjunctivitis is commonly called "pink eye." Conjunctivitis can be caused by bacterial or viral infection, allergies, or injuries. There is usually redness of the lining of the eye, itching, discomfort, and sometimes discharge. There may be deposits of matter along the eyelids. A viral infection usually causes a watery discharge, while a bacterial infection causes a yellowish, thick discharge. Pink eye is very contagious and spreads by direct contact. °You may be given antibiotic eyedrops as part of your treatment. Before using your eye medicine, remove all drainage from the eye by washing gently with warm water and cotton balls. Continue to use the medication until you have awakened 2 mornings in a row without discharge from the eye. Do not rub your eye. This increases the irritation and helps spread infection. Use separate towels from other household members. Wash your hands with soap and water before and after touching your eyes. Use cold compresses to reduce pain and sunglasses to relieve irritation from light. Do not wear contact lenses or wear eye makeup until the infection is gone. °SEEK MEDICAL CARE IF:  °· Your symptoms are not better after 3 days of treatment. °· You have increased pain or trouble seeing. °· The outer eyelids become very red or swollen. °Document Released: 05/04/2004 Document Revised: 06/19/2011 Document Reviewed: 03/27/2005 °ExitCare® Patient Information ©2014 [employer]. ° °Sinusitis °Sinusitis is redness, soreness, and swelling (inflammation) of the paranasal sinuses. Paranasal sinuses are air pockets within the bones of your face (beneath the eyes, the middle of the forehead, or above the eyes). In healthy paranasal sinuses, mucus is able to drain out, and air is able to circulate through them by way of your nose. However, when your paranasal sinuses are inflamed, mucus and air can become trapped. This can allow bacteria and other germs to grow and cause  infection. °Sinusitis can develop quickly and last only a short time (acute) or continue over a long period (chronic). Sinusitis that lasts for more than 12 weeks is considered chronic.  °CAUSES  °Causes of sinusitis include: °· Allergies. °· Structural abnormalities, such as displacement of the cartilage that separates your nostrils (deviated septum), which can decrease the air flow through your nose and sinuses and affect sinus drainage. °· Functional abnormalities, such as when the small hairs (cilia) that line your sinuses and help remove mucus do not work properly or are not present. °SYMPTOMS  °Symptoms of acute and chronic sinusitis are the same. The primary symptoms are pain and pressure around the affected sinuses. Other symptoms include: °· Upper toothache. °· Earache. °· Headache. °· Bad breath. °· Decreased sense of smell and taste. °· A cough, which worsens when you are lying flat. °· Fatigue. °· Fever. °· Thick drainage from your nose, which often is green and may contain pus (purulent). °· Swelling and warmth over the affected sinuses. °DIAGNOSIS  °Your caregiver will perform a physical exam. During the exam, your caregiver may: °· Look in your nose for signs of abnormal growths in your nostrils (nasal polyps). °· Tap over the affected sinus to check for signs of infection. °· View the inside of your sinuses (endoscopy) with a special imaging device with a light attached (endoscope), which is inserted into your sinuses. °If your caregiver suspects that you have chronic sinusitis, one or more of the following tests may be recommended: °· Allergy tests. °· Nasal culture A sample of mucus is taken from your nose and sent to a lab and screened for bacteria. °· Nasal cytology A sample   of mucus is taken from your nose and examined by your caregiver to determine if your sinusitis is related to an allergy. °TREATMENT  °Most cases of acute sinusitis are related to a viral infection and will resolve on their  own within 10 days. Sometimes medicines are prescribed to help relieve symptoms (pain medicine, decongestants, nasal steroid sprays, or saline sprays).  °However, for sinusitis related to a bacterial infection, your caregiver will prescribe antibiotic medicines. These are medicines that will help kill the bacteria causing the infection.  °Rarely, sinusitis is caused by a fungal infection. In theses cases, your caregiver will prescribe antifungal medicine. °For some cases of chronic sinusitis, surgery is needed. Generally, these are cases in which sinusitis recurs more than 3 times per year, despite other treatments. °HOME CARE INSTRUCTIONS  °· Drink plenty of water. Water helps thin the mucus so your sinuses can drain more easily. °· Use a humidifier. °· Inhale steam 3 to 4 times a day (for example, sit in the bathroom with the shower running). °· Apply a warm, moist washcloth to your face 3 to 4 times a day, or as directed by your caregiver. °· Use saline nasal sprays to help moisten and clean your sinuses. °· Take over-the-counter or prescription medicines for pain, discomfort, or fever only as directed by your caregiver. °SEEK IMMEDIATE MEDICAL CARE IF: °· You have increasing pain or severe headaches. °· You have nausea, vomiting, or drowsiness. °· You have swelling around your face. °· You have vision problems. °· You have a stiff neck. °· You have difficulty breathing. °MAKE SURE YOU:  °· Understand these instructions. °· Will watch your condition. °· Will get help right away if you are not doing well or get worse. °Document Released: 03/27/2005 Document Revised: 06/19/2011 Document Reviewed: 04/11/2011 °ExitCare® Patient Information ©2014 [employer]. ° °

## 2013-04-15 NOTE — ED Provider Notes (Signed)
Medical screening examination/treatment/procedure(s) were performed by resident physician or non-physician practitioner and as supervising physician I was immediately available for consultation/collaboration.   Pauline Good MD.   Billy Fischer, MD 04/15/13 [id number]

## 2013-07-04 ENCOUNTER — Other Ambulatory Visit: Payer: Self-pay | Admitting: Endocrinology

## 2013-07-04 DIAGNOSIS — E049 Nontoxic goiter, unspecified: Secondary | ICD-10-CM

## 2013-07-25 ENCOUNTER — Other Ambulatory Visit: Payer: 59

## 2014-01-01 ENCOUNTER — Encounter: Payer: Self-pay | Admitting: Internal Medicine

## 2014-01-01 ENCOUNTER — Ambulatory Visit (INDEPENDENT_AMBULATORY_CARE_PROVIDER_SITE_OTHER): Payer: 59 | Admitting: Internal Medicine

## 2014-01-01 VITALS — BP 148/96 | HR 97 | Ht 67.0 in | Wt 202.4 lb

## 2014-01-01 DIAGNOSIS — J302 Other seasonal allergic rhinitis: Secondary | ICD-10-CM

## 2014-01-01 DIAGNOSIS — J3089 Other allergic rhinitis: Secondary | ICD-10-CM

## 2014-01-01 DIAGNOSIS — J309 Allergic rhinitis, unspecified: Secondary | ICD-10-CM

## 2014-01-01 DIAGNOSIS — G4733 Obstructive sleep apnea (adult) (pediatric): Secondary | ICD-10-CM

## 2014-01-01 MED ORDER — AZELASTINE-FLUTICASONE 137-50 MCG/ACT NA SUSP: 2.0000 | Freq: Every day | NASAL | Status: DC

## 2014-01-01 NOTE — Assessment & Plan Note (Signed)
Great compliance and control. Uses routinely, sleeping well. Discussed replacement of machine, Medicare rules. Plan- replacement of supplies

## 2014-01-01 NOTE — Progress Notes (Signed)
Subjective:    Patient ID: Emily Kelley, female    DOB: 1951/04/05, 63 y.o.   MRN: [id number]  HPI 12/29/10- 82yoF followed for asthma, allergic rhinitis, OSA, complicated by hypothyroidism. Last here December 19, 2009.  Since last here she says she has been doing "great". She continues CPAP through [facility] at 10 CWP using a nasal pillows mask. She asks for a prescription for replacement of mask and supplies. Her machine mechanically works well. She uses it all night every night  " cannot live without it". She has not had recognizable asthma since childhood. Denies nasal congestion sneezing or drainage. Recognizes minor watering of her eyes occasionally and can manage this with over-the-counter antihistamines. We're need for over-the-counter decongestant which we discussed. She did not need and did not fill prescription for Nasonex last year. She was unclear about the distinction between nasal decongestant sprays and nasal steroid sprays so we spent time discussing that.  12/28/11- 44 yoF followed for asthma, allergic rhinitis, OSA, complicated by hypothyroidism. Wears CPAP 10/ [facility] 8 Hrs. avg at hs, pressure. good, doing well, needs new mask, etc. Allergic rhinitis has been fairly well controlled. Sporadic use of Nasonex seasonally. Declines flu vaccine  12/27/12- 61 yoF followed for asthma, allergic rhinitis, OSA, complicated by hypothyroidism. FOLLOWS FOR: wears CPAP 10/ [facility] every night for about 8 hours; pressure working well for patient; due for new supplies to [facility], but can't sleep w/o CPAP. She declines flu vax since no problems in years- discussed. Prefers Dymista over Nasonex.  01/01/14- 62 yoF never smoker followed for asthma, allergic rhinitis, OSA, complicated by hypothyroidism. FOLLOWS FOR: Wears CPAP 10/ [facility] every night for about 9 hours; DME is [facility]. Will need order for new supplies Depends on CPAP even for  naps. Feel well rested, very satisfied with no changes needed. Likes Dymista used only prn. Puts up with some nasal congestion. She declines flu vaccine, routinely- discussed.  Review of Systems- see HPI Constitutional:   No-   weight loss, night sweats, fevers, chills, fatigue, lassitude. HEENT:   No-  headaches, difficulty swallowing, tooth/dental problems, sore throat,       + sneezing, itching, ear ache, +nasal congestion, post nasal drip,  CV:  No-   chest pain, orthopnea, PND, swelling in lower extremities, anasarca,  dizziness, palpitations Resp: No-   shortness of breath with exertion or at rest.              No-   productive cough,  No non-productive cough,  No-  coughing up of blood.              No-   change in color of mucus.  No- wheezing.   Skin: No-   rash or lesions. GI:  No-   heartburn, indigestion, abdominal pain, nausea, vomiting,  GU:  MS:  No-   joint pain or swelling. Marland Kitchen Psych:  No- change in mood or affect. No depression or anxiety.  No memory loss.   Objective:   Physical Exam- exam similar. General- Alert, Oriented, Affect-appropriate, Distress- none acute Skin- rash-none, lesions- none, excoriation- none Lymphadenopathy- none Head- atraumatic            Eyes- Gross vision intact, PERRLA, conjunctivae clear secretions.+ Mild bilateral                             periorbital edema.  Ears- Hearing, canals normal            Nose- + turbinate edema, no-Septal dev, mucus, polyps, erosion, perforation             Throat- Mallampati III-IV , mucosa clear , drainage- none, tonsils- atrophic Neck- flexible , trachea midline, no stridor , thyroid nl, carotid no bruit Chest - symmetrical excursion , unlabored           Heart/CV- RRR , no murmur , no gallop  , no rub, nl s1 s2                           - JVD- none , edema- none, stasis changes- none, varices- none           Lung- clear to P&A, wheeze- none, cough- none , dullness-none, rub- none           Chest  wall-  Abd-  Br/ Gen/ Rectal- Not done, not indicated Extrem- cyanosis- none, clubbing, none, atrophy- none, strength- nl Neuro- grossly intact to observation    Assessment & Plan:

## 2014-01-01 NOTE — Assessment & Plan Note (Signed)
Explained maintenance use of Dymista during seasons of need

## 2014-01-01 NOTE — Patient Instructions (Signed)
Order- DME [facility]- replacement CPAP mask of [facility] and supplies     Dx OSA  Sample and script for Dymista nasal spray   1-2 puffs each nostril once daily while needed

## 2014-01-28 ENCOUNTER — Encounter: Payer: Self-pay | Admitting: Podiatry

## 2014-01-28 ENCOUNTER — Ambulatory Visit (INDEPENDENT_AMBULATORY_CARE_PROVIDER_SITE_OTHER): Payer: 59 | Admitting: Podiatry

## 2014-01-28 VITALS — BP 125/83 | HR 97 | Resp 17

## 2014-01-28 DIAGNOSIS — L600 Ingrowing nail: Secondary | ICD-10-CM

## 2014-01-28 NOTE — Progress Notes (Signed)
   Subjective:    Patient ID: Emily Kelley, female    DOB: 01-Feb-1951, 64 y.o.   MRN: [id number]  HPI  Pt presents with right great imgrown nail, was removed previuosly, has grown back and is painful  Review of Systems     Objective:   Physical Exam        Assessment & Plan:

## 2014-01-28 NOTE — Patient Instructions (Signed)

## 2014-01-28 NOTE — Progress Notes (Signed)
Subjective:     Patient ID: Emily Kelley, female   DOB: October 10, 1950, 63 y.o.   MRN: [id number]  HPI patient states I'm having trouble with his right hallux nail that's been removed before it doesn't seem is thick but there's areas that become tender   Review of Systems     Objective:   Physical Exam Neurovascular status intact with a thin right hallux nail that is painful when pressed from a dorsal direction    Assessment:     Reoccurrence of right hallux nailbed    Plan:     Reviewed removing this again and explained procedure. Patient wants this done and today I infiltrated 60 mg Xylocaine Marcaine mixture remove the hallux nailbed exposed matrix and applied phenol 3 applications 30 seconds followed by alcohol lavaged and sterile dressing

## 2014-01-30 ENCOUNTER — Ambulatory Visit: Payer: Self-pay | Admitting: Podiatrist

## 2014-01-30 ENCOUNTER — Other Ambulatory Visit: Payer: Self-pay | Admitting: Endocrinology

## 2014-01-30 DIAGNOSIS — E049 Nontoxic goiter, unspecified: Secondary | ICD-10-CM

## 2014-03-27 IMAGING — CR DG CHEST 2V
2 series · 2 of 2 positions shown · non-contrast
Comparison: None.

CLINICAL DATA: Cough and sore throat

EXAM:
CHEST  2 VIEW

[view not recorded (1 of 2)]
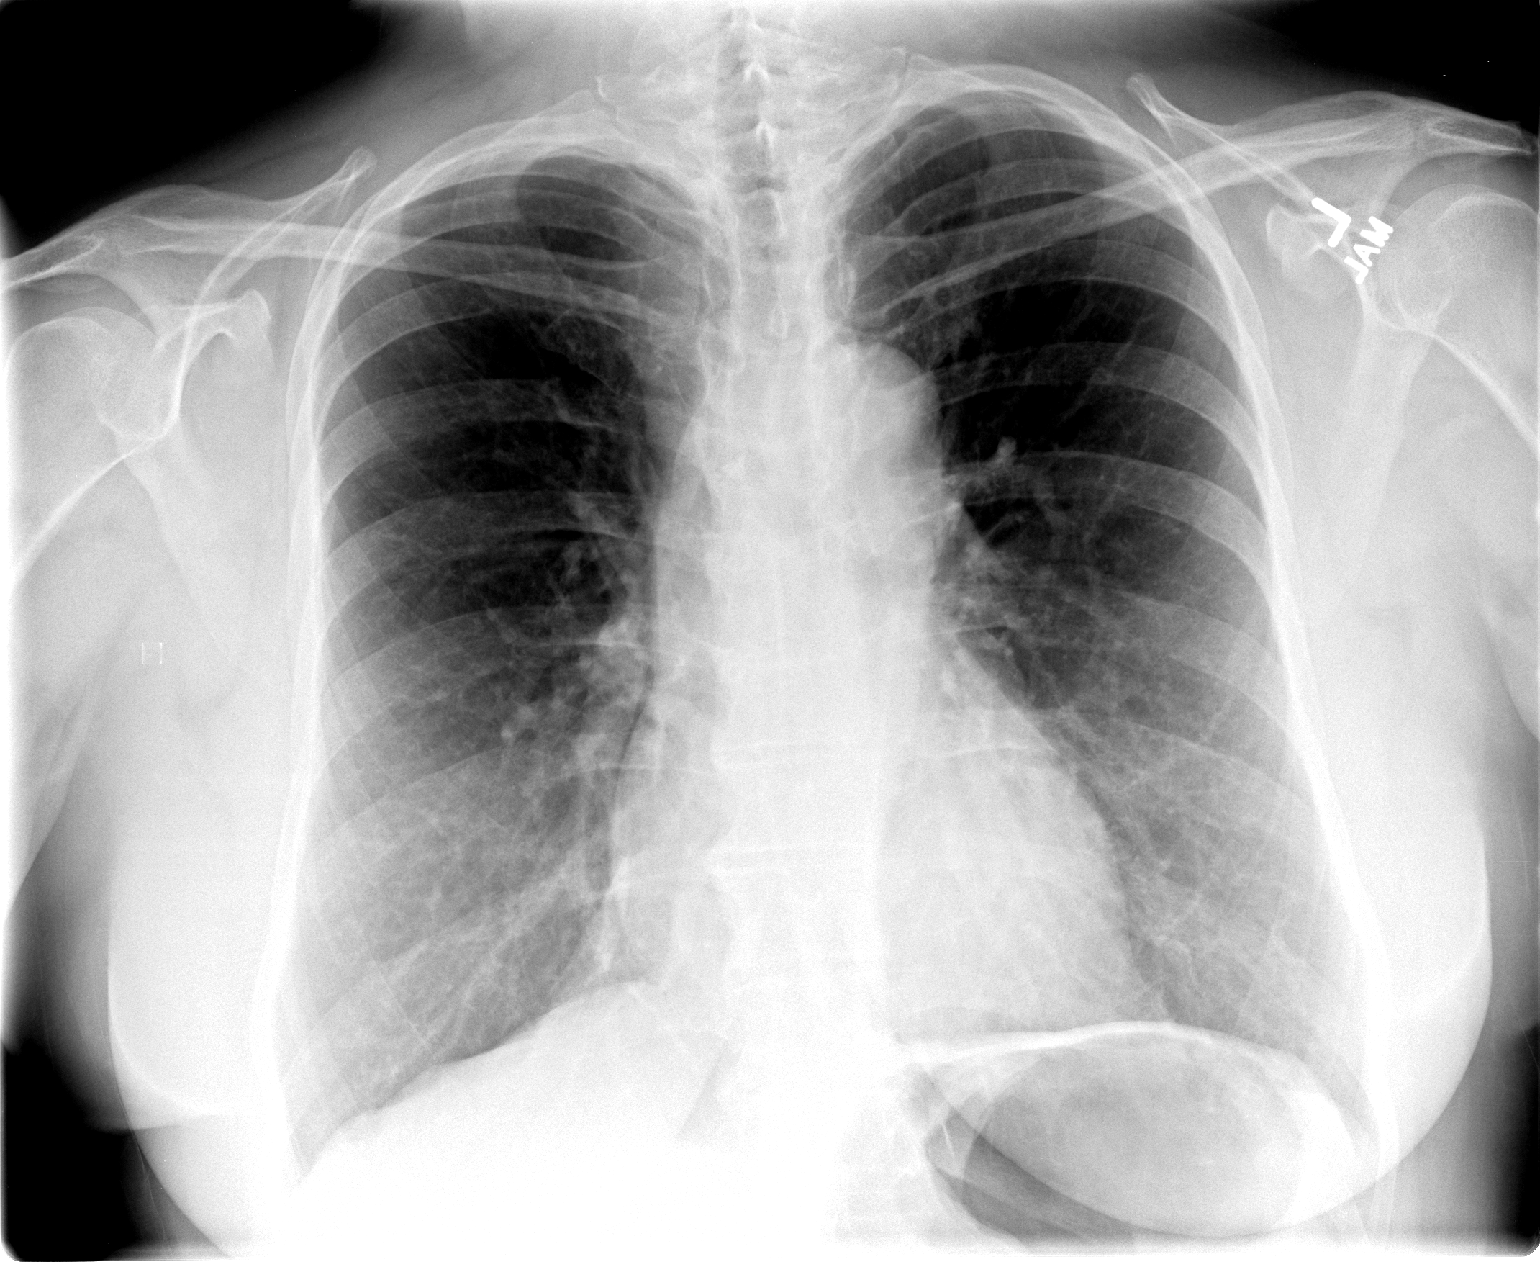

[view not recorded (2 of 2)]
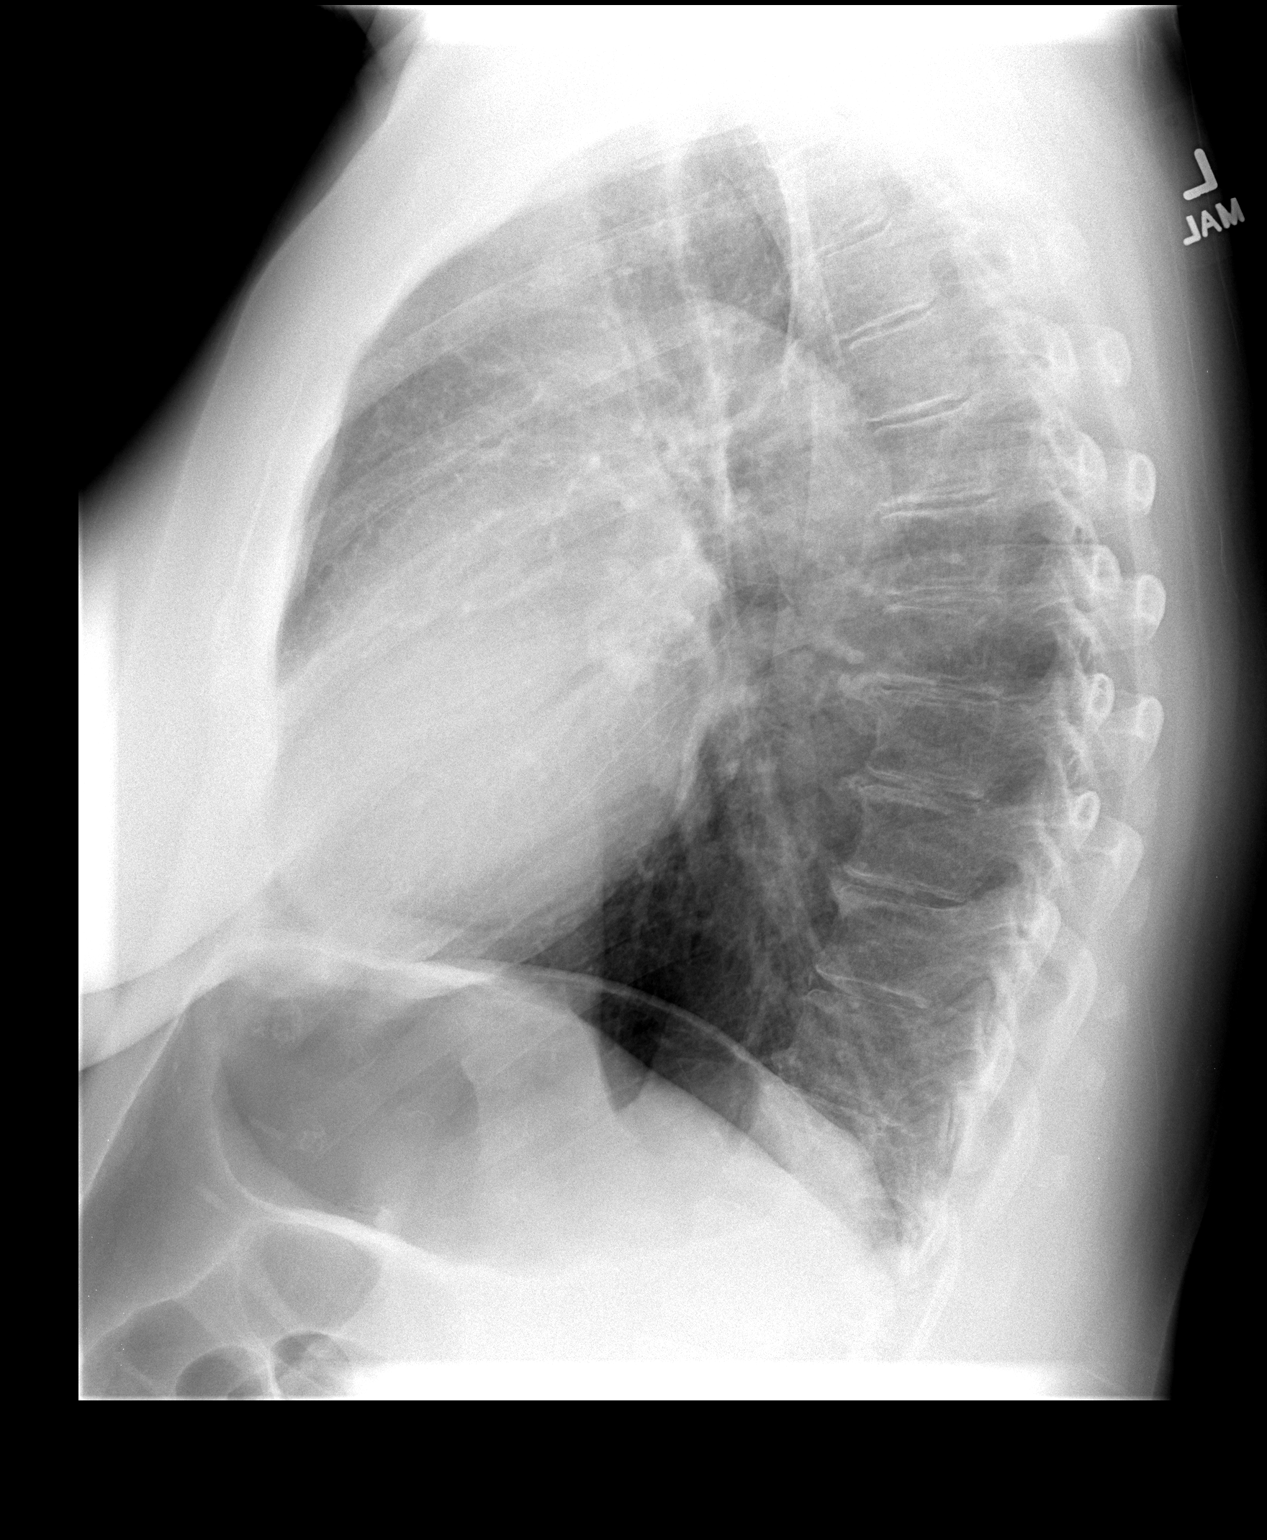

[2 of 2 positions shown; findings below may reference images not displayed]

FINDINGS: The heart size and mediastinal contours are within normal limits.
Both lungs are clear. The visualized skeletal structures are
unremarkable.
IMPRESSION: No active cardiopulmonary disease.

## 2014-06-26 ENCOUNTER — Other Ambulatory Visit: Payer: Self-pay | Admitting: Obstetrics and Gynecology

## 2014-06-30 LAB — CYTOLOGY - PAP

## 2014-07-03 ENCOUNTER — Other Ambulatory Visit: Payer: 59

## 2014-09-11 ENCOUNTER — Ambulatory Visit
Admission: RE | Admit: 2014-09-11 | Discharge: 2014-09-11 | Disposition: A | Discharge status: Home / Self Care | Payer: 59 | Source: Ambulatory Visit | Attending: Endocrinology | Admitting: Endocrinology

## 2014-09-11 DIAGNOSIS — E049 Nontoxic goiter, unspecified: Secondary | ICD-10-CM

## 2015-01-07 ENCOUNTER — Other Ambulatory Visit (INDEPENDENT_AMBULATORY_CARE_PROVIDER_SITE_OTHER): Payer: 59

## 2015-01-07 ENCOUNTER — Ambulatory Visit (INDEPENDENT_AMBULATORY_CARE_PROVIDER_SITE_OTHER): Payer: 59 | Admitting: Internal Medicine

## 2015-01-07 ENCOUNTER — Encounter: Payer: Self-pay | Admitting: Internal Medicine

## 2015-01-07 VITALS — BP 118/68 | HR 71 | Ht 68.0 in | Wt 178.0 lb

## 2015-01-07 DIAGNOSIS — J302 Other seasonal allergic rhinitis: Secondary | ICD-10-CM

## 2015-01-07 DIAGNOSIS — J309 Allergic rhinitis, unspecified: Secondary | ICD-10-CM

## 2015-01-07 DIAGNOSIS — G4733 Obstructive sleep apnea (adult) (pediatric): Secondary | ICD-10-CM | POA: Diagnosis not present

## 2015-01-07 DIAGNOSIS — J3089 Other allergic rhinitis: Secondary | ICD-10-CM

## 2015-01-07 DIAGNOSIS — L509 Urticaria, unspecified: Secondary | ICD-10-CM

## 2015-01-07 DIAGNOSIS — J4520 Mild intermittent asthma, uncomplicated: Secondary | ICD-10-CM

## 2015-01-07 LAB — CBC WITH DIFFERENTIAL/PLATELET
BASOS ABS: 0 10*3/uL (ref 0.0–0.1)
Basophils Relative: 0.5 % (ref 0.0–3.0)
EOS ABS: 0.1 10*3/uL (ref 0.0–0.7)
Eosinophils Relative: 2.7 % (ref 0.0–5.0)
HCT: 43.6 % (ref 36.0–46.0)
Hemoglobin: 14.2 g/dL (ref 12.0–15.0)
LYMPHS ABS: 1.8 10*3/uL (ref 0.7–4.0)
Lymphocytes Relative: 33.4 % (ref 12.0–46.0)
MCHC: 32.6 g/dL (ref 30.0–36.0)
MCV: 81.3 fl (ref 78.0–100.0)
MONO ABS: 0.5 10*3/uL (ref 0.1–1.0)
MONOS PCT: 9.3 % (ref 3.0–12.0)
NEUTROS ABS: 2.9 10*3/uL (ref 1.4–7.7)
NEUTROS PCT: 54.1 % (ref 43.0–77.0)
PLATELETS: 208 10*3/uL (ref 150.0–400.0)
RBC: 5.37 Mil/uL — ABNORMAL HIGH (ref 3.87–5.11)
RDW: 13.9 % (ref 11.5–15.5)
WBC: 5.3 10*3/uL (ref 4.0–10.5)

## 2015-01-07 MED ORDER — AZELASTINE-FLUTICASONE 137-50 MCG/ACT NA SUSP: NASAL | Status: DC

## 2015-01-07 MED ORDER — METHYLPREDNISOLONE ACETATE 80 MG/ML IJ SUSP
80.0000 mg | Freq: Once | INTRAMUSCULAR | Status: AC
  Administered 2015-01-07: 80 mg via INTRAMUSCULAR

## 2015-01-07 NOTE — Progress Notes (Signed)
Subjective:    Patient ID: Emily Kelley, female    DOB: 1950/12/02, 64 y.o.   MRN: [id number]  HPI 12/29/10- 64yoF followed for asthma, allergic rhinitis, OSA, complicated by hypothyroidism. Last here December 19, 2009.  Since last here she says she has been doing "great". She continues CPAP through [facility] at 10 CWP using a nasal pillows mask. She asks for a prescription for replacement of mask and supplies. Her machine mechanically works well. She uses it all night every night  " cannot live without it". She has not had recognizable asthma since childhood. Denies nasal congestion sneezing or drainage. Recognizes minor watering of her eyes occasionally and can manage this with over-the-counter antihistamines. We're need for over-the-counter decongestant which we discussed. She did not need and did not fill prescription for Nasonex last year. She was unclear about the distinction between nasal decongestant sprays and nasal steroid sprays so we spent time discussing that.  12/28/11- 64 yoF followed for asthma, allergic rhinitis, OSA, complicated by hypothyroidism. Wears CPAP 10/ [facility] 8 Hrs. avg at hs, pressure. good, doing well, needs new mask, etc. Allergic rhinitis has been fairly well controlled. Sporadic use of Nasonex seasonally. Declines flu vaccine  12/27/12- 64 yoF followed for asthma, allergic rhinitis, OSA, complicated by hypothyroidism. FOLLOWS FOR: wears CPAP 10/ [facility] every night for about 8 hours; pressure working well for patient; due for new supplies to [place] [facility], but can't sleep w/o CPAP. She declines flu vax since no problems in years- discussed. Prefers Dymista over Nasonex.  01/01/14- 64 yoF never smoker followed for asthma, allergic rhinitis, OSA, complicated by hypothyroidism. FOLLOWS FOR: Wears CPAP 10/ [facility] every night for about 9 hours; DME is [facility]. Will need order for new supplies Depends on CPAP even for  naps. Feel well rested, very satisfied with no changes needed. Likes Dymista used only prn. Puts up with some nasal congestion. She declines flu vaccine, routinely- discussed.  01/07/15- 64 yoF never smoker followed for asthma, allergic rhinitis, urticaria, OSA, complicated by hypothyroidism. CPAP [facility]                 had flu vaccine FOLLOWS FOR: Pt wears CPAP every night for about 8 hours; DME is [facility]. No supplies needed at this time. No DL from DME-will need to order-unable to get enrolled in [place]. Uses CPAP whenever she sleeps. No asthma since age 64, no wheezing and no use for rescue inhaler Nasal stuffiness with weather change Urticaria last few days limited to left side of neck with no recognized exposure  Review of Systems- see HPI Constitutional:   No-   weight loss, night sweats, fevers, chills, fatigue, lassitude. HEENT:   No-  headaches, difficulty swallowing, tooth/dental problems, sore throat,       + sneezing, itching, ear ache, +nasal congestion, post nasal drip,  CV:  No-   chest pain, orthopnea, PND, swelling in lower extremities, anasarca,  dizziness, palpitations Resp: No-   shortness of breath with exertion or at rest.              No-   productive cough,  No non-productive cough,  No-  coughing up of blood.              No-   change in color of mucus.  No- wheezing.   Skin: +HPI GI:  No-   heartburn, indigestion, abdominal pain, nausea, vomiting,  GU:  MS:  No-   joint pain or swelling. Marland Kitchen Psych:  No- change in mood or affect. No depression or anxiety.  No memory loss.   Objective:   Physical Exam- exam similar. General- Alert, Oriented, Affect-appropriate, Distress- none acute Skin- + erythema with hives left side of neck no excoriation. Lymphadenopathy- none Head- atraumatic            Eyes- Gross vision intact, PERRLA, conjunctivae clear secretions.                   + Mild bilateral periorbital edema.            Ears- Hearing,  canals normal            Nose- + turbinate edema, no-Septal dev, mucus, polyps, erosion, perforation             Throat- Mallampati III-IV , mucosa clear , drainage- none, tonsils- atrophic Neck- flexible , trachea midline, no stridor , thyroid nl, carotid no bruit Chest - symmetrical excursion , unlabored           Heart/CV- RRR , no murmur , no gallop  , no rub, nl s1 s2                           - JVD- none , edema- none, stasis changes- none, varices- none           Lung- clear to P&A, wheeze- none, cough- none , dullness-none, rub- none           Chest wall-  Abd-  Br/ Gen/ Rectal- Not done, not indicated Extrem- cyanosis- none, clubbing, none, atrophy- none, strength- nl Neuro- grossly intact to observation    Assessment & Plan:

## 2015-01-07 NOTE — Patient Instructions (Signed)
Script, sample and coupon to try using Dymista nasal spray once daily for a while to see if it helps.  Depo 80  Order lab- Allergy profile, CBC w diff    Dx allergic rhinitis, urticaria  Order- DME [facility]    Download CPAP for pressure compliance, add AirView if available   Dx OSA

## 2015-01-08 DIAGNOSIS — L509 Urticaria, unspecified: Secondary | ICD-10-CM | POA: Insufficient documentation

## 2015-01-08 LAB — ALLERGY FULL PROFILE
Allergen, D pternoyssinus,d7: 0.69 kU/L — ABNORMAL HIGH
Allergen,Goose feathers, e70: <0.1 kU/L
Aspergillus fumigatus, m3: <0.1 kU/L
Bahia Grass: <0.1 kU/L
Bermuda Grass: <0.1 kU/L
Box Elder IgE: <0.1 kU/L
CAT DANDER: 0.72 kU/L — AB
Common Ragweed: <0.1 kU/L
D. farinae: 0.7 kU/L — ABNORMAL HIGH
Dog Dander: <0.1 kU/L
Elm IgE: <0.1 kU/L
G005 Rye, Perennial: <0.1 kU/L
G009 Red Top: <0.1 kU/L
House Dust Hollister: 0.2 kU/L — ABNORMAL HIGH
IGE (IMMUNOGLOBULIN E), SERUM: 22 kU/L (ref <115)
Oak: <0.1 kU/L
PLANTAIN: 0.11 kU/L — AB
Sycamore Tree: <0.1 kU/L
Timothy Grass: <0.1 kU/L

## 2015-01-08 NOTE — Assessment & Plan Note (Signed)
Recent onset localized urticaria without obvious medication or exposure to trigger. This may be viral. Plan-Depo-Medrol, H1 and H2 antihistamines as discussed

## 2015-01-08 NOTE — Assessment & Plan Note (Signed)
Well-controlled with no significant issue at this time

## 2015-01-08 NOTE — Assessment & Plan Note (Signed)
Good compliance and control. Pressure is appropriate. No change indicated. She continues to need CPAP.

## 2015-01-11 ENCOUNTER — Telehealth: Payer: Self-pay | Admitting: Internal Medicine

## 2015-01-11 NOTE — Telephone Encounter (Signed)
lmtcb for pt.    Notes Recorded by Deneise Lever, MD on 01/08/2015 at 2:26 PM Allergy lab results- mild elevation of allergy antibody levels against house dust mites and cat.

## 2015-01-12 NOTE — Telephone Encounter (Signed)
Pt is aware of results. Nothing further was needed. 

## 2015-01-12 NOTE — Telephone Encounter (Signed)
lmtcb x2 for pt. 

## 2015-01-12 NOTE — Telephone Encounter (Signed)
Patient is returning call, states she is calling on her work breaks.  She states she has looked at my chart and seen her results.  She gives permission to leave a message on what the call is regarding since we are having a hard time getting her.

## 2015-01-18 ENCOUNTER — Telehealth: Payer: Self-pay | Admitting: Internal Medicine

## 2015-01-18 NOTE — Progress Notes (Signed)
Quick Note:  Attempted to contact pt, no answer. LMOM to call back. ______ 

## 2015-01-19 NOTE — Telephone Encounter (Signed)
Pt has already gotten the lab results that we have been calling about.  Nothing further needed. Patient does not want another call.   Please sign off on the labs because they have been given to her last week.

## 2015-02-08 ENCOUNTER — Encounter: Payer: Self-pay | Admitting: Internal Medicine

## 2015-02-16 ENCOUNTER — Telehealth: Payer: Self-pay | Admitting: Internal Medicine

## 2015-02-16 NOTE — Telephone Encounter (Signed)
Left message to call back  

## 2015-02-17 NOTE — Telephone Encounter (Signed)
I have not seen any DL's on patient; please have DME to re-fax; If CY reviewed he may have put in scan folder that was sent to [facility]. Thanks.

## 2015-02-17 NOTE — Telephone Encounter (Signed)
Pt calling wanting to know if we received download from DME. Please advise Katie and Dr. Annamaria Boots? thanks

## 2015-02-17 NOTE — Telephone Encounter (Signed)
Attempted to call pt. Left detailed VM with CY's recs per patient's request. I explained if she had any questions to return our call. Nothing further needed

## 2015-02-17 NOTE — Telephone Encounter (Signed)
Latest compliance report showed machine set on 10. She is doing great, using it every night with excellent control.  No reason to make changes unless she has a concern.

## 2015-02-17 NOTE — Telephone Encounter (Signed)
LVM for pt to return call

## 2015-02-17 NOTE — Telephone Encounter (Signed)
Compliance report was scanned in the chart on 02/08/15 and was signed by CY. No recs or results were wrote on scan. Please have CY to review

## 2015-02-17 NOTE — Telephone Encounter (Signed)
Pt cb, [phone] States it is ok to leave detailed message on vm because she works and may not be able to answer when we cb

## 2016-01-06 ENCOUNTER — Ambulatory Visit (INDEPENDENT_AMBULATORY_CARE_PROVIDER_SITE_OTHER): Payer: 59 | Admitting: Internal Medicine

## 2016-01-06 ENCOUNTER — Encounter: Payer: Self-pay | Admitting: Internal Medicine

## 2016-01-06 DIAGNOSIS — G4733 Obstructive sleep apnea (adult) (pediatric): Secondary | ICD-10-CM

## 2016-01-06 DIAGNOSIS — J309 Allergic rhinitis, unspecified: Secondary | ICD-10-CM

## 2016-01-06 DIAGNOSIS — J302 Other seasonal allergic rhinitis: Secondary | ICD-10-CM

## 2016-01-06 DIAGNOSIS — J3089 Other allergic rhinitis: Principal | ICD-10-CM

## 2016-01-06 MED ORDER — AZELASTINE-FLUTICASONE 137-50 MCG/ACT NA SUSP: NASAL | 99 refills | Status: DC

## 2016-01-06 NOTE — Progress Notes (Signed)
Subjective:    Patient ID: Emily Kelley, female    DOB: 1950/04/14, 65 y.o.   MRN: [id number]  HPI F followed for asthma, allergic rhinitis, OSA, complicated by hypothyroidism.   01/07/15- 33 yoF never smoker followed for asthma, allergic rhinitis, urticaria, OSA, complicated by hypothyroidism. CPAP 10 [facility]                 had flu vaccine FOLLOWS FOR: Pt wears CPAP every night for about 8 hours; DME is [facility]. No supplies needed at this time. No DL from DME-will need to order-unable to get enrolled in [place]. Uses CPAP whenever she sleeps. No asthma since age 41, no wheezing and no use for rescue inhaler Nasal stuffiness with weather change Urticaria last few days limited to left side of neck with no recognized exposure  01/06/2016-65 year old female never smoker followed for asthma, allergic rhinitis, urticaria, OSA, complicated by hypothyroidism CPAP 10/[facility] Download compliance/pressure report after last visit showed 100% 4 hour compliance with excellent control. FOLLOWS FOR: DME: [facility]; Will need to order DL from CPAP-pt will bring machine by office for DL. Pressure okay and wears every night and naps. Will need to order supplies.  Dieting to lose weight. Had flu shot. Can't sleep without CPAP even for naps. Nasal pillows mask. Pressure 10 is very comfortable and she says she sleeps "great" with no daytime sleepiness. Has not wheezed in years and she outgrew asthma at age 87. No shortness of breath with activity. Nasal congestion comes and goes. She likes Dymista but we discussed alternatives. Rash last year identified as poison ivy to which she is extremely sensitive.   Review of Systems- see HPI Constitutional:   +Dieting weight loss, no- night sweats, fevers, chills, fatigue, lassitude. HEENT:   No-  headaches, difficulty swallowing, tooth/dental problems, sore throat,       + sneezing, itching, ear ache, +nasal congestion, post nasal  drip,  CV:  No-   chest pain, orthopnea, PND, swelling in lower extremities, anasarca,  dizziness, palpitations Resp: No-   shortness of breath with exertion or at rest.              No-   productive cough,  No non-productive cough,  No-  coughing up of blood.              No-   change in color of mucus.  No- wheezing.   Skin: +HPI GI:  No-   heartburn, indigestion, abdominal pain, nausea, vomiting,  GU:  MS:  No-   joint pain or swelling. Marland Kitchen Psych:  No- change in mood or affect. No depression or anxiety.  No memory loss.   Objective:   Physical Exam- exam similar. General- Alert, Oriented, Affect-appropriate, Distress- none acute Skin- Clear Lymphadenopathy- none Head- atraumatic            Eyes- Gross vision intact, PERRLA, conjunctivae clear secretions.                   + Mild bilateral periorbital edema.            Ears- Hearing, canals normal            Nose- + turbinate edema, no-Septal dev, mucus, polyps, erosion, perforation             Throat- Mallampati III-IV , mucosa clear , drainage- none, tonsils- atrophic Neck- flexible , trachea midline, no stridor , thyroid nl, carotid no bruit Chest - symmetrical excursion ,  unlabored           Heart/CV- RRR , no murmur , no gallop  , no rub, nl s1 s2                           - JVD- none , edema- none, stasis changes- none, varices- none           Lung- clear to P&A, wheeze- none, cough- none , dullness-none, rub- none           Chest wall-  Abd-  Br/ Gen/ Rectal- Not done, not indicated Extrem- cyanosis- none, clubbing, none, atrophy- none, strength- nl Neuro- grossly intact to observation    Assessment & Plan:

## 2016-01-06 NOTE — Patient Instructions (Signed)
We can continue CPAP 10/ [facility]  Script printed for Dymista nasal spray  Sample Dymista    1-2 puffs each nostril once or twice daily when needed  You can try Sudafed decongestant from the pharmacy, otc flonase nasal spray, saline nasal spray  Stay out of the poison ivy !!!!

## 2016-01-08 NOTE — Assessment & Plan Note (Signed)
She says she can't sleep without CPAP and pressure 10 is quite good with nasal pillows mask. Feels great, sleeps well. Denies daytime sleepiness. Denies kicking or significant limb movement disturbance. It has helped that she has been able to lose weight.

## 2016-01-08 NOTE — Assessment & Plan Note (Signed)
She is tolerating nasal pillows mask on CPAP. Does want prescription to hold for Dymista. We also discussed Sudafed as a decongestant, and use of nasal saline.

## 2016-01-20 ENCOUNTER — Encounter: Payer: Self-pay | Admitting: Internal Medicine

## 2016-01-20 ENCOUNTER — Telehealth: Payer: Self-pay | Admitting: Internal Medicine

## 2016-01-20 DIAGNOSIS — G4733 Obstructive sleep apnea (adult) (pediatric): Secondary | ICD-10-CM

## 2016-01-20 NOTE — Telephone Encounter (Signed)
Order entered for patient to have CPAP supplies renewed per last OV.  Left detailed message on patient's voicemail advising her that order has been entered. Nothing further needed.

## 2016-01-21 ENCOUNTER — Other Ambulatory Visit: Payer: Self-pay | Admitting: Internal Medicine

## 2016-01-21 ENCOUNTER — Telehealth: Payer: Self-pay | Admitting: Internal Medicine

## 2016-01-21 NOTE — Telephone Encounter (Signed)
Called and spoke with [facility] and they stated that they were needing the CMN and they did receive this so they will get the supplies to the pt.    Called to make the pt aware.  Had to lmom to make her aware.

## 2016-01-21 NOTE — Telephone Encounter (Signed)
Download printed and placed on CY's cart. Patient inquired about her cpap supplies, we found the CMN for the CPAP supplies and [facility] faxed CMN.  Patient aware.  Dr. Annamaria Boots, please advise regarding download results.

## 2016-01-24 NOTE — Telephone Encounter (Signed)
Download was placed on CY's cart, Katie, please advise if this has been reviewed.  Thanks.

## 2016-01-25 NOTE — Telephone Encounter (Signed)
Spoke with pt. She is aware of results. Nothing further was needed.  

## 2016-01-25 NOTE — Telephone Encounter (Signed)
lmtcb x1 for pt. 

## 2016-01-25 NOTE — Telephone Encounter (Signed)
CPAP download showed excellent compliance- using it every night for at least 4 hours. Also excellent control of apneas at set pressure of 10. Looking very good.

## 2016-12-29 ENCOUNTER — Ambulatory Visit: Payer: 59 | Admitting: Internal Medicine

## 2017-02-07 ENCOUNTER — Encounter: Payer: Self-pay | Admitting: Internal Medicine

## 2017-02-08 ENCOUNTER — Ambulatory Visit (INDEPENDENT_AMBULATORY_CARE_PROVIDER_SITE_OTHER): Payer: 59 | Admitting: Internal Medicine

## 2017-02-08 ENCOUNTER — Encounter: Payer: Self-pay | Admitting: Internal Medicine

## 2017-02-08 VITALS — BP 118/68 | HR 85 | Ht 68.0 in | Wt 183.0 lb

## 2017-02-08 DIAGNOSIS — G4733 Obstructive sleep apnea (adult) (pediatric): Secondary | ICD-10-CM

## 2017-02-08 NOTE — Progress Notes (Signed)
Subjective:    Patient ID: Emily Kelley, female    DOB: Nov 28, 1950, 66 y.o.   MRN: [id number]  HPI F followed for asthma, allergic rhinitis, OSA, complicated by hypothyroidism. NPSG 01/19/1999 [facility], [place]) AHI 14.95/hour, desaturation to 86%, body weight 194 pounds --------------------------------------------------------------------------- 01/06/2016-66 year old female never smoker followed for asthma, allergic rhinitis, urticaria, OSA, complicated by hypothyroidism CPAP 10/[facility] Download compliance/pressure report after last visit showed 100% 4 hour compliance with excellent control. FOLLOWS FOR: DME: [facility]; Will need to order DL from CPAP-pt will bring machine by office for DL. Pressure okay and wears every night and naps. Will need to order supplies.  Dieting to lose weight. Had flu shot. Can't sleep without CPAP even for naps. Nasal pillows mask. Pressure 10 is very comfortable and she says she sleeps "great" with no daytime sleepiness. Has not wheezed in years and she outgrew asthma at age 89. No shortness of breath with activity. Nasal congestion comes and goes. She likes Dymista but we discussed alternatives. Rash last year identified as poison ivy to which she is extremely sensitive.  02/08/17- 66 year old female never smoker followed for asthma, allergic rhinitis, urticaria, OSA, complicated by hypothyroidism CPAP 10/[facility] -OSA; DME: [facility], Pt continues to wear CPAP nightly and nap time; DL attached. Needs new order for supplies.  She says she cannot even nap without CPAP.  Download confirms 97% compliance, AHI 2.9/hour.  Denies daytime sleepiness.  Still working full-time for [employer] and feels she has plenty of energy. No recent asthma or rhinitis problems.  Rare use of rescue inhaler.  Review of Systems- see HPI + = positive Constitutional:    no- night sweats, fevers, chills, fatigue, lassitude. HEENT:   No-   headaches, difficulty swallowing, tooth/dental problems, sore throat,        sneezing, itching, ear ache, nasal congestion, post nasal drip,  CV:  No-   chest pain, orthopnea, PND, swelling in lower extremities, anasarca,  dizziness, palpitations Resp: No-   shortness of breath with exertion or at rest.              No-   productive cough,  No non-productive cough,  No-  coughing up of blood.              No-   change in color of mucus.  No- wheezing.   Skin: No rash GI:  No-   heartburn, indigestion, abdominal pain, nausea, vomiting,  GU:  MS:  No-   joint pain or swelling. Marland Kitchen Psych:  No- change in mood or affect. No depression or anxiety.  No memory loss.   Objective:   Physical Exam- exam similar. General- Alert, Oriented, Affect-appropriate, Distress- none acute Skin- Clear Lymphadenopathy- none Head- atraumatic            Eyes- Gross vision intact, PERRLA, conjunctivae clear secretions.                   + Mild bilateral periorbital edema.            Ears- Hearing, canals normal            Nose- + turbinate edema, no-Septal dev, mucus, polyps, erosion, perforation             Throat- Mallampati III-IV , mucosa clear , drainage- none, tonsils- atrophic Neck- flexible , trachea midline, no stridor , thyroid nl, carotid no bruit Chest - symmetrical excursion , unlabored  Heart/CV- RRR , no murmur , no gallop  , no rub, nl s1 s2                           - JVD- none , edema- none, stasis changes- none, varices- none           Lung- clear to P&A, wheeze- none, cough- none , dullness-none, rub- none           Chest wall-  Abd-  Br/ Gen/ Rectal- Not done, not indicated Extrem- cyanosis- none, clubbing, none, atrophy- none, strength- nl Neuro- grossly intact to observation    Assessment & Plan:

## 2017-02-08 NOTE — Patient Instructions (Signed)
Order- DME [facility]- Patient is ready for new supplies. Please replace old CPAP machine if eligible, changing to auto 5-15. Continue humidifier, mask of choice,    Dx OSA  Please call if we can help

## 2017-02-09 NOTE — Assessment & Plan Note (Signed)
She is very pleased with CPAP, feels that she continues to benefit and  her compliance is confirmed by download. Plan-when and if she is eligible for replacement of old machine, will change to AutoPap 5-15

## 2017-02-12 ENCOUNTER — Other Ambulatory Visit (HOSPITAL_COMMUNITY): Payer: Self-pay | Admitting: Endocrinology

## 2017-02-12 DIAGNOSIS — E210 Primary hyperparathyroidism: Secondary | ICD-10-CM

## 2017-02-22 ENCOUNTER — Other Ambulatory Visit: Payer: Self-pay | Admitting: Endocrinology

## 2017-02-22 DIAGNOSIS — E049 Nontoxic goiter, unspecified: Secondary | ICD-10-CM

## 2017-02-23 ENCOUNTER — Encounter (HOSPITAL_COMMUNITY): Payer: 59

## 2017-10-31 ENCOUNTER — Encounter: Payer: Self-pay | Admitting: Internal Medicine

## 2017-11-01 ENCOUNTER — Ambulatory Visit (INDEPENDENT_AMBULATORY_CARE_PROVIDER_SITE_OTHER): Payer: 59 | Admitting: Internal Medicine

## 2017-11-01 ENCOUNTER — Encounter: Payer: Self-pay | Admitting: Internal Medicine

## 2017-11-01 VITALS — BP 120/76 | HR 73 | Ht 68.0 in | Wt 186.2 lb

## 2017-11-01 DIAGNOSIS — J3089 Other allergic rhinitis: Secondary | ICD-10-CM

## 2017-11-01 DIAGNOSIS — G4733 Obstructive sleep apnea (adult) (pediatric): Secondary | ICD-10-CM

## 2017-11-01 DIAGNOSIS — J302 Other seasonal allergic rhinitis: Secondary | ICD-10-CM

## 2017-11-01 NOTE — Progress Notes (Signed)
Subjective:    Patient ID: Emily Kelley, female    DOB: 04-Jun-1950, 67 y.o.   MRN: [id number]  HPI F followed for asthma, allergic rhinitis, OSA, complicated by hypothyroidism. NPSG 01/19/1999 ([place], [place]) AHI 14.95/hour, desaturation to 86%, body weight 194 pounds --------------------------------------------------------------------------- 02/08/17- 67 year old female never smoker followed for asthma, allergic rhinitis, urticaria, OSA, complicated by hypothyroidism CPAP 10/[facility] -OSA; DME: [facility], Pt continues to wear CPAP nightly and nap time; DL attached. Needs new order for supplies.  She says she cannot even nap without CPAP.  Download confirms 97% compliance, AHI 2.9/hour.  Denies daytime sleepiness.  Still working full-time for [employer] and feels she has plenty of energy. No recent asthma or rhinitis problems.  Rare use of rescue inhaler.  11/01/2017- 67 year old female never smoker followed for asthma, allergic rhinitis, urticaria, OSA, complicated by hypothyroidism CPAP 10/[facility] -----DME: [facility]; Pt uses CPAP but needs new one-message on current machine states the motor life is done. DL attached.  Current machine is 69 years old.  She says she cannot sleep without it even for naps.  Download 100% compliance AHI 3.4/hour.  Has to sit up with power goes out.  Otherwise reports her general health has been very good with no recent colds or significant medical events.  Review of Systems- see HPI + = positive Constitutional:    no- night sweats, fevers, chills, fatigue, lassitude. HEENT:   No-  headaches, difficulty swallowing, tooth/dental problems, sore throat,        sneezing, itching, ear ache, nasal congestion, post nasal drip,  CV:  No-   chest pain, orthopnea, PND, swelling in lower extremities, anasarca,  dizziness, palpitations Resp: No-   shortness of breath with exertion or at rest.              No-   productive  cough,  No non-productive cough,  No-  coughing up of blood.              No-   change in color of mucus.  No- wheezing.   Skin: No rash GI:  No-   heartburn, indigestion, abdominal pain, nausea, vomiting,  GU:  MS:  No-   joint pain or swelling. Marland Kitchen Psych:  No- change in mood or affect. No depression or anxiety.  No memory loss.   Objective:   Physical Exam- exam similar. General- Alert, Oriented, Affect-appropriate, Distress- none acute Skin- Clear Lymphadenopathy- none Head- atraumatic            Eyes- Gross vision intact, PERRLA, conjunctivae clear secretions..            Ears- Hearing, canals normal            Nose- + turbinate edema, no-Septal dev, mucus, polyps, erosion, perforation             Throat- Mallampati III-IV , mucosa clear , drainage- none, tonsils- atrophic Neck- flexible , trachea midline, no stridor , thyroid nl, carotid no bruit Chest - symmetrical excursion , unlabored           Heart/CV- RRR , no murmur , no gallop  , no rub, nl s1 s2                           - JVD- none , edema- none, stasis changes- none, varices- none           Lung- clear to P&A, wheeze- none, cough-  none , dullness-none, rub- none           Chest wall-  Abd-  Br/ Gen/ Rectal- Not done, not indicated Extrem- cyanosis- none, clubbing, none, atrophy- none, strength- nl Neuro- grossly intact to observation    Assessment & Plan:

## 2017-11-01 NOTE — Patient Instructions (Signed)
Order- DME [facility]  Please replace old CPAP, changing to auto 5-15, mask of choice, humidifier, supplies, AirView  Please call if we can help

## 2017-11-01 NOTE — Assessment & Plan Note (Signed)
She continues to benefit from CPAP with good compliance and control confirmed by download.  Sleeps much better with it.  Needs old machine replaced. Plan-replace old CPAP machine, changing to auto 5-15

## 2017-11-01 NOTE — Assessment & Plan Note (Signed)
Stays a little stuffy but has avoided major seasonal exacerbations.

## 2018-01-16 ENCOUNTER — Other Ambulatory Visit (HOSPITAL_COMMUNITY): Payer: Self-pay | Admitting: Endocrinology

## 2018-01-16 DIAGNOSIS — E215 Disorder of parathyroid gland, unspecified: Secondary | ICD-10-CM

## 2018-01-16 DIAGNOSIS — E049 Nontoxic goiter, unspecified: Secondary | ICD-10-CM

## 2018-01-28 ENCOUNTER — Encounter (HOSPITAL_COMMUNITY): Payer: Self-pay

## 2018-01-28 ENCOUNTER — Ambulatory Visit (HOSPITAL_COMMUNITY): Payer: 59

## 2018-02-01 ENCOUNTER — Ambulatory Visit (INDEPENDENT_AMBULATORY_CARE_PROVIDER_SITE_OTHER): Payer: 59 | Admitting: Internal Medicine

## 2018-02-01 ENCOUNTER — Encounter: Payer: Self-pay | Admitting: Internal Medicine

## 2018-02-01 VITALS — BP 122/70 | HR 79 | Ht 68.0 in | Wt 183.4 lb

## 2018-02-01 DIAGNOSIS — G4733 Obstructive sleep apnea (adult) (pediatric): Secondary | ICD-10-CM

## 2018-02-01 DIAGNOSIS — J302 Other seasonal allergic rhinitis: Secondary | ICD-10-CM | POA: Diagnosis not present

## 2018-02-01 DIAGNOSIS — J3089 Other allergic rhinitis: Secondary | ICD-10-CM | POA: Diagnosis not present

## 2018-02-01 NOTE — Assessment & Plan Note (Signed)
Doing well this fall without significant exacerbation.  Flonase and nonsedating antihistamines are available if needed.

## 2018-02-01 NOTE — Assessment & Plan Note (Signed)
She continues to benefit with good sleep quality using CPAP.  Doing very well.  Download confirms. Plan-continue CPAP auto 5-15

## 2018-02-01 NOTE — Patient Instructions (Signed)
Glad you are doing well. We can continue CPAP auto 5-15.  Please call if we can help

## 2018-02-01 NOTE — Progress Notes (Signed)
Subjective:    Patient ID: Emily Kelley, female    DOB: 02-Jul-1950, 67 y.o.   MRN: [id number]  HPI F followed for asthma, allergic rhinitis, OSA, complicated by hypothyroidism. NPSG 01/19/1999 [facility], [place]) AHI 14.95/hour, desaturation to 86%, body weight 194 pounds --------------------------------------------------------------------------- 11/01/2017- 67 year old female never smoker followed for asthma, allergic rhinitis, urticaria, OSA, complicated by hypothyroidism CPAP 10/[facility] -----DME: [facility]; Pt uses CPAP but needs new one-message on current machine states the motor life is done. DL attached.  Current machine is 69 years old.  She says she cannot sleep without it even for naps.  Download 100% compliance AHI 3.4/hour.  Has to sit up with power goes out.  Otherwise reports her general health has been very good with no recent colds or significant medical events.  02/01/2018- 67 year old female never smoker followed for asthma, allergic rhinitis, urticaria, OSA, complicated by hypothyroidism CPAP auto 5-15/[facility] -----OSA: DME: [facility] Pt wears CPAP and DL attached. No new supplies needed at this time.  Download 100% compliance AHI 3.9/hour. She feels well rested, likes her sense of energy, likes her new machine and is very satisfied with her status.  Denies additional pertinent health problems.  Review of Systems- see HPI + = positive Constitutional:    no- night sweats, fevers, chills, fatigue, lassitude. HEENT:   No-  headaches, difficulty swallowing, tooth/dental problems, sore throat,        sneezing, itching, ear ache, nasal congestion, post nasal drip,  CV:  No-   chest pain, orthopnea, PND, swelling in lower extremities, anasarca,  dizziness, palpitations Resp: No-   shortness of breath with exertion or at rest.              No-   productive cough,  No non-productive cough,  No-  coughing up of blood.              No-   change  in color of mucus.  No- wheezing.   Skin: No rash GI:  No-   heartburn, indigestion, abdominal pain, nausea, vomiting,  GU:  MS:  No-   joint pain or swelling. Marland Kitchen Psych:  No- change in mood or affect. No depression or anxiety.  No memory loss.   Objective:   Physical Exam- exam similar. General- Alert, Oriented, Affect-appropriate, Distress- none acute Skin- Clear Lymphadenopathy- none Head- atraumatic            Eyes- Gross vision intact, PERRLA, conjunctivae clear secretions..            Ears- Hearing, canals normal            Nose- clear, no-Septal dev, mucus, polyps, erosion, perforation             Throat- Mallampati III-IV , mucosa clear , drainage- none, tonsils- atrophic Neck- flexible , trachea midline, no stridor , thyroid nl, carotid no bruit Chest - symmetrical excursion , unlabored           Heart/CV- RRR , no murmur , no gallop  , no rub, nl s1 s2                           - JVD- none , edema- none, stasis changes- none, varices- none           Lung- clear to P&A, wheeze- none, cough- none , dullness-none, rub- none           Chest wall-  Abd-  Br/ Gen/ Rectal- Not done, not indicated Extrem- cyanosis- none, clubbing, none, atrophy- none, strength- nl Neuro- grossly intact to observation    Assessment & Plan:

## 2018-02-14 ENCOUNTER — Ambulatory Visit: Payer: 59 | Admitting: Internal Medicine

## 2019-01-21 ENCOUNTER — Encounter: Payer: Self-pay | Admitting: Internal Medicine

## 2019-01-23 ENCOUNTER — Ambulatory Visit (INDEPENDENT_AMBULATORY_CARE_PROVIDER_SITE_OTHER): Payer: 59 | Admitting: Internal Medicine

## 2019-01-23 ENCOUNTER — Encounter: Payer: Self-pay | Admitting: Internal Medicine

## 2019-01-23 ENCOUNTER — Other Ambulatory Visit: Payer: Self-pay

## 2019-01-23 VITALS — BP 122/72 | HR 84 | Temp 97.1°F | Ht 67.0 in | Wt 187.8 lb

## 2019-01-23 DIAGNOSIS — J3089 Other allergic rhinitis: Secondary | ICD-10-CM | POA: Diagnosis not present

## 2019-01-23 DIAGNOSIS — J302 Other seasonal allergic rhinitis: Secondary | ICD-10-CM

## 2019-01-23 DIAGNOSIS — Z23 Encounter for immunization: Secondary | ICD-10-CM

## 2019-01-23 DIAGNOSIS — G4733 Obstructive sleep apnea (adult) (pediatric): Secondary | ICD-10-CM

## 2019-01-23 DIAGNOSIS — E039 Hypothyroidism, unspecified: Secondary | ICD-10-CM | POA: Diagnosis not present

## 2019-01-23 DIAGNOSIS — D497 Neoplasm of unspecified behavior of endocrine glands and other parts of nervous system: Secondary | ICD-10-CM

## 2019-01-23 NOTE — Assessment & Plan Note (Signed)
Uncomplicated resection done for hyper-para. Doing well on follow-up.

## 2019-01-23 NOTE — Assessment & Plan Note (Signed)
Replacement therapy managed by Endocrinology/

## 2019-01-23 NOTE — Assessment & Plan Note (Signed)
Better Fall season this year. Controlling with otc Mucinex spray if needed.

## 2019-01-23 NOTE — Assessment & Plan Note (Signed)
Good compliance and control- benefits with better sleep. Plan- replace supplies, continue CPAP auto 5-15

## 2019-01-23 NOTE — Patient Instructions (Signed)
Order- flu vax senior  Order- DME [facility]- please replace mask of choice, and supplies,, continue CPAP auto 5-15, humidifier, AirView/ card  Please call us if we can help

## 2019-01-23 NOTE — Progress Notes (Signed)
Subjective:    Patient ID: Emily Kelley, female    DOB: Apr 04, 1951, 68 y.o.   MRN: [id number]  HPI F followed for asthma, allergic rhinitis, OSA, complicated by hypothyroidism. NPSG 01/19/1999 [facility], [place]) AHI 14.95/hour, desaturation to 86%, body weight 194 pounds ---------------------------------------------------------------------------  02/01/2018- 68 year old female never smoker followed for asthma, allergic rhinitis, urticaria, OSA, complicated by hypothyroidism CPAP auto 5-15/[facility] -----OSA: DME: [facility] Pt wears CPAP and DL attached. No new supplies needed at this time.  Download 100% compliance AHI 3.9/hour. She feels well rested, likes her sense of energy, likes her new machine and is very satisfied with her status.  Denies additional pertinent health problems.  01/23/2019- 68 year old female never smoker followed for asthma, allergic rhinitis, urticaria, OSA, complicated by hypothyroidism/ parathyroidectomy, hypothyroid,  CPAP auto 5-15/[facility] Body weight today 187 lbs Download compliance 100%, AHI 4.2/ hr -----OSA on CPAP auto 5-15, DME [facility]; no complaints Won't lie down without her CPAP. Sleeps very well and feels well rested. Download reviewed. Allergic rhinitis managed with occasional use of otc nasal spray. Better this Fall season. Covid vaccine potential discussed. Working from home. Intubated for parathyroidectomy she had done in [place]. No respiratory problems with the procedure.  Review of Systems- see HPI   + = positive Constitutional:    no- night sweats, fevers, chills, fatigue, lassitude. HEENT:   No-  headaches, difficulty swallowing, tooth/dental problems, sore throat,        sneezing, itching, ear ache, nasal congestion, post nasal drip,  CV:  No-   chest pain, orthopnea, PND, swelling in lower extremities, anasarca,  dizziness, palpitations Resp: No-   shortness of breath with exertion or at  rest.              No-   productive cough,  No non-productive cough,  No-  coughing up of blood.              No-   change in color of mucus.  No- wheezing.   Skin: No rash GI:  No-   heartburn, indigestion, abdominal pain, nausea, vomiting,  GU:  MS:  No-   joint pain or swelling. Marland Kitchen Psych:  No- change in mood or affect. No depression or anxiety.  No memory loss.   Objective:   Physical Exam-  General- Alert, Oriented, Affect-appropriate, Distress- none acute, +overweight Skin- Clear Lymphadenopathy- none Head- atraumatic            Eyes- Gross vision intact, PERRLA, conjunctivae clear secretions..            Ears- Hearing, canals normal            Nose- clear, no-Septal dev, mucus, polyps, erosion, perforation             Throat- Mallampati III-IV , mucosa clear , drainage- none, tonsils- atrophic Neck- flexible , trachea midline, no stridor , thyroid nl, carotid no bruit Chest - symmetrical excursion , unlabored           Heart/CV- RRR , no murmur , no gallop  , no rub, nl s1 s2                           - JVD- none , edema- none, stasis changes- none, varices- none           Lung- clear to P&A, wheeze- none, cough- none , dullness-none, rub- none  Chest wall-  Abd-  Br/ Gen/ Rectal- Not done, not indicated Extrem- cyanosis- none, clubbing, none, atrophy- none, strength- nl Neuro- grossly intact to observation    Assessment & Plan:

## 2019-01-30 ENCOUNTER — Other Ambulatory Visit: Payer: Self-pay | Admitting: Endocrinology

## 2019-01-30 DIAGNOSIS — E049 Nontoxic goiter, unspecified: Secondary | ICD-10-CM

## 2019-05-02 ENCOUNTER — Other Ambulatory Visit: Payer: Self-pay | Admitting: Family Medicine

## 2019-05-02 DIAGNOSIS — M8580 Other specified disorders of bone density and structure, unspecified site: Secondary | ICD-10-CM

## 2019-09-22 ENCOUNTER — Other Ambulatory Visit: Payer: 59

## 2019-12-12 ENCOUNTER — Other Ambulatory Visit: Payer: Self-pay

## 2019-12-12 ENCOUNTER — Ambulatory Visit
Admission: RE | Admit: 2019-12-12 | Discharge: 2019-12-12 | Disposition: A | Discharge status: Home / Self Care | Payer: 59 | Source: Ambulatory Visit | Attending: Family Medicine | Admitting: Family Medicine

## 2019-12-12 DIAGNOSIS — M8580 Other specified disorders of bone density and structure, unspecified site: Secondary | ICD-10-CM

## 2020-01-01 ENCOUNTER — Encounter: Payer: Self-pay | Admitting: Internal Medicine

## 2020-01-01 ENCOUNTER — Other Ambulatory Visit: Payer: Self-pay

## 2020-01-01 ENCOUNTER — Ambulatory Visit (INDEPENDENT_AMBULATORY_CARE_PROVIDER_SITE_OTHER): Payer: 59 | Admitting: Internal Medicine

## 2020-01-01 VITALS — BP 120/70 | HR 72 | Temp 95.2°F | Ht 67.0 in | Wt 195.2 lb

## 2020-01-01 DIAGNOSIS — E039 Hypothyroidism, unspecified: Secondary | ICD-10-CM | POA: Diagnosis not present

## 2020-01-01 DIAGNOSIS — G4733 Obstructive sleep apnea (adult) (pediatric): Secondary | ICD-10-CM

## 2020-01-01 NOTE — Assessment & Plan Note (Signed)
Benefits from CPAP and continue good compliance and control Plan- Request new supplies from DME, continue auto 5-15

## 2020-01-01 NOTE — Progress Notes (Signed)
Subjective:    Patient ID: Emily Kelley, female    DOB: 1950/05/15, 69 y.o.   MRN: [id number]  HPI F followed for asthma, allergic rhinitis, OSA, complicated by hypothyroidism, hx parathyroidectomy, NPSG 01/19/1999 [place], [place]) AHI 14.95/hour, desaturation to 86%, body weight 194 pounds ---------------------------------------------------------------------------   01/23/2019- 69 year old female never smoker followed for asthma, allergic rhinitis, urticaria, OSA, complicated by hypothyroidism/ parathyroidectomy, hypothyroid,  CPAP auto 5-15/[facility] Body weight today 187 lbs Download compliance 100%, AHI 4.2/ hr -----OSA on CPAP auto 5-15, DME [facility]; no complaints Won't lie down without her CPAP. Sleeps very well and feels well rested. Download reviewed. Allergic rhinitis managed with occasional use of otc nasal spray. Better this Fall season. Covid vaccine potential discussed. Working from home. Intubated for parathyroidectomy she had done in [place]. No respiratory problems with the procedure.  01/01/20- 69 year old female never smoker followed for asthma, allergic rhinitis, urticaria, OSA, complicated by hypothyroidism/ parathyroidectomy,  CPAP auto 5-15/[facility] Download- compliance 100%, AH/i 4.5/ hr Body weight today- 195 lbs Covid vax- 2 Phizer Flu vax- Defer -----Pt states she has been doing good since last visit. States she has been doing well on CPAP. DME: [facility] Machine is about 71 yrs old,functioning well. Needs supplies. Still working. No health changes.   Review of Systems- see HPI   + = positive Constitutional:    no- night sweats, fevers, chills, fatigue, lassitude. HEENT:   No-  headaches, difficulty swallowing, tooth/dental problems, sore throat,        sneezing, itching, ear ache, nasal congestion, post nasal drip,  CV:  No-   chest pain, orthopnea, PND, swelling in lower extremities, anasarca,  dizziness,  palpitations Resp: No-   shortness of breath with exertion or at rest.              No-   productive cough,  No non-productive cough,  No-  coughing up of blood.              No-   change in color of mucus.  No- wheezing.   Skin: No rash GI:  No-   heartburn, indigestion, abdominal pain, nausea, vomiting,  GU:  MS:  No-   joint pain or swelling. Marland Kitchen Psych:  No- change in mood or affect. No depression or anxiety.  No memory loss.   Objective:   Physical Exam-  General- Alert, Oriented, Affect-appropriate, Distress- none acute, +overweight Skin- Clear Lymphadenopathy- none Head- atraumatic            Eyes- Gross vision intact, PERRLA, conjunctivae clear secretions..            Ears- Hearing, canals normal            Nose- clear, no-Septal dev, mucus, polyps, erosion, perforation             Throat- Mallampati III-IV , mucosa clear , drainage- none, tonsils- atrophic Neck- flexible , trachea midline, no stridor , thyroid nl, carotid no bruit Chest - symmetrical excursion , unlabored           Heart/CV- RRR , no murmur , no gallop  , no rub, nl s1 s2                           - JVD- none , edema- none, stasis changes- none, varices- none           Lung- clear to P&A, wheeze- none, cough- none ,  dullness-none, rub- none           Chest wall-  Abd-  Br/ Gen/ Rectal- Not done, not indicated Extrem- cyanosis- none, clubbing, none, atrophy- none, strength- nl Neuro- grossly intact to observation    Assessment & Plan:

## 2020-01-01 NOTE — Patient Instructions (Signed)
Order- DME [facility]   Please replace mask of choice, supplies, continue auto 5-15, humidifier, AirView/ card  Please cal if we can help

## 2020-01-01 NOTE — Assessment & Plan Note (Signed)
Continues endocrine supervision. Feels well controlled.

## 2020-01-16 ENCOUNTER — Ambulatory Visit
Admission: RE | Admit: 2020-01-16 | Discharge: 2020-01-16 | Disposition: A | Discharge status: Home / Self Care | Payer: 59 | Source: Ambulatory Visit | Attending: Endocrinology | Admitting: Endocrinology

## 2020-01-16 ENCOUNTER — Other Ambulatory Visit: Payer: 59

## 2020-01-16 DIAGNOSIS — E049 Nontoxic goiter, unspecified: Secondary | ICD-10-CM

## 2021-01-05 ENCOUNTER — Encounter: Payer: Self-pay | Admitting: Internal Medicine

## 2021-01-05 NOTE — Progress Notes (Signed)
Subjective:    Patient ID: Emily Kelley, female    DOB: [birth year], 70 y.o.   MRN: [id number]  HPI F followed for asthma, allergic rhinitis, OSA, complicated by hypothyroidism, hx parathyroidectomy, NPSG 01/19/1999 [place], [place]) AHI 14.95/hour, desaturation to 86%, body weight 194 pounds ---------------------------------------------------------------------------    01/01/20- 70 year old female never smoker followed for asthma, allergic rhinitis, urticaria, OSA, complicated by hypothyroidism/ parathyroidectomy,  CPAP auto 5-15/[facility] Download- compliance 100%, AH/i 4.5/ hr Body weight today- 195 lbs Covid vax- 2 Phizer Flu vax- Defer -----Pt states she has been doing good since last visit. States she has been doing well on CPAP. DME: [facility] Machine is about 70 yrs old,functioning well. Needs supplies. Still working. No health changes.   01/06/21- 70 year old female never smoker followed for OSA, complicated by hx asthma, allergic rhinitis, urticaria,  hypothyroidism/ parathyroidectomy,  CPAP auto 5-15/[facility] Download-compliance 100%, AHI 4.1/ hr Body weight today-165 lbs Covid vax-2 Phizer    I suggested she get updated booster Flu vax-declined She is comfortable with her CPAP, won't sleep or nap without it. Download reviewed. She states her insurance will pay to replace machine at 3 years and she asks for replacement now- discussed supply chain delays.  Asthma- childhood only- last acute was at age 32.   Review of Systems- see HPI   + = positive Constitutional:    no- night sweats, fevers, chills, fatigue, lassitude. HEENT:   No-  headaches, difficulty swallowing, tooth/dental problems, sore throat,        sneezing, itching, ear ache, nasal congestion, post nasal drip,  CV:  No-   chest pain, orthopnea, PND, swelling in lower extremities, anasarca,  dizziness, palpitations Resp: No-   shortness of breath with exertion or at rest.               No-   productive cough,  No non-productive cough,  No-  coughing up of blood.              No-   change in color of mucus.  No- wheezing.   Skin: No rash GI:  No-   heartburn, indigestion, abdominal pain, nausea, vomiting,  GU:  MS:  No-   joint pain or swelling. Marland Kitchen Psych:  No- change in mood or affect. No depression or anxiety.  No memory loss.   Objective:   Physical Exam-  General- Alert, Oriented, Affect-appropriate, Distress- none acute,  Skin- Clear Lymphadenopathy- none Head- atraumatic            Eyes- Gross vision intact, PERRLA, conjunctivae clear secretions..            Ears- Hearing, canals normal            Nose- clear, no-Septal dev, mucus, polyps, erosion, perforation             Throat- Mallampati III-IV , mucosa clear , drainage- none, tonsils- atrophic Neck- flexible , trachea midline, no stridor , thyroid nl, carotid no bruit Chest - symmetrical excursion , unlabored           Heart/CV- RRR , no murmur , no gallop  , no rub, nl s1 s2                           - JVD- none , edema- none, stasis changes- none, varices- none           Lung- clear to P&A, wheeze- none, cough- none ,  dullness-none, rub- none           Chest wall-  Abd-  Br/ Gen/ Rectal- Not done, not indicated Extrem- cyanosis- none, clubbing, none, atrophy- none, strength- nl Neuro- grossly intact to observation    Assessment & Plan:

## 2021-01-06 ENCOUNTER — Other Ambulatory Visit: Payer: Self-pay

## 2021-01-06 ENCOUNTER — Ambulatory Visit (INDEPENDENT_AMBULATORY_CARE_PROVIDER_SITE_OTHER): Payer: 59 | Admitting: Internal Medicine

## 2021-01-06 ENCOUNTER — Encounter: Payer: Self-pay | Admitting: Internal Medicine

## 2021-01-06 VITALS — BP 128/60 | HR 80 | Temp 97.6°F | Ht 67.0 in | Wt 165.4 lb

## 2021-01-06 DIAGNOSIS — J4520 Mild intermittent asthma, uncomplicated: Secondary | ICD-10-CM | POA: Diagnosis not present

## 2021-01-06 DIAGNOSIS — G4733 Obstructive sleep apnea (adult) (pediatric): Secondary | ICD-10-CM | POA: Diagnosis not present

## 2021-01-06 NOTE — Assessment & Plan Note (Signed)
Benefits with good compliance and control. She requests new machine and says her insurance will cover. Plan- DME [facility] replace machine auto 5-15

## 2021-01-06 NOTE — Patient Instructions (Signed)
Order- DME [facility]   please replace old CPAP machine auto 5-15, mask of choice, humidifier, supplies, AirView/ card  Please call if we can help

## 2021-01-06 NOTE — Assessment & Plan Note (Signed)
Not active since age 70

## 2021-01-24 ENCOUNTER — Other Ambulatory Visit: Payer: Self-pay | Admitting: Family Medicine

## 2021-01-24 DIAGNOSIS — R269 Unspecified abnormalities of gait and mobility: Secondary | ICD-10-CM

## 2021-02-04 ENCOUNTER — Ambulatory Visit
Admission: RE | Admit: 2021-02-04 | Discharge: 2021-02-04 | Disposition: A | Discharge status: Home / Self Care | Payer: 59 | Source: Ambulatory Visit | Attending: Family Medicine | Admitting: Family Medicine

## 2021-02-04 DIAGNOSIS — R269 Unspecified abnormalities of gait and mobility: Secondary | ICD-10-CM

## 2021-02-04 MED ORDER — GADOBENATE DIMEGLUMINE 529 MG/ML IV SOLN
15.0000 mL | Freq: Once | INTRAVENOUS | Status: AC | PRN
  Administered 2021-02-04: 15 mL via INTRAVENOUS

## 2021-02-16 ENCOUNTER — Other Ambulatory Visit: Payer: 59

## 2021-11-14 ENCOUNTER — Ambulatory Visit (INDEPENDENT_AMBULATORY_CARE_PROVIDER_SITE_OTHER): Payer: Medicare Other | Admitting: Neurology

## 2021-11-14 ENCOUNTER — Encounter: Payer: Self-pay | Admitting: Neurology

## 2021-11-14 ENCOUNTER — Telehealth: Payer: Self-pay | Admitting: Neurology

## 2021-11-14 VITALS — BP 138/77 | HR 75 | Ht 67.0 in | Wt 175.0 lb

## 2021-11-14 DIAGNOSIS — R269 Unspecified abnormalities of gait and mobility: Secondary | ICD-10-CM | POA: Diagnosis not present

## 2021-11-14 DIAGNOSIS — G20 Parkinson's disease: Secondary | ICD-10-CM

## 2021-11-14 NOTE — Patient Instructions (Signed)
Continue current medications  Referral to physical therapy for gait training  DAT Scan  Follow up in 3 months or sooner if worse

## 2021-11-14 NOTE — Telephone Encounter (Signed)
Referral for PT sent to [place] [phone].

## 2021-11-14 NOTE — Progress Notes (Signed)
[facility]  PATIENT: Emily Kelley DOB: Jul 16, 1950  REQUESTING CLINICIAN: London Pepper, MD HISTORY FROM: Patient  REASON FOR VISIT: Gait abnormalities    HISTORICAL  CHIEF COMPLAINT:  Chief Complaint  Patient presents with   New Patient (Initial Visit)    Rm 12 alone NP/[facility] @ T[facility] Men MD [phone] persistent imbalance, sx started 1 year ago, PT was not helpful. Using cane     HISTORY OF PRESENT ILLNESS:  This is a 71 year old woman past medical history of hypertension and hypothyroidism who is presenting with gait abnormality for the past year.  Patient reports balance problem that suddenly started a year ago.  He she feels like she is unsteady like she is going to pass out but acutely never passed out.  She denies any vertigo feeling, denies any room spinning sensation, denies any hearing loss and no tinnitus.  She reported doing physical therapy/vestibular therapy in the beginning of the year with some improvement but her gait is still an issue.  Currently she uses a cane for ambulation and denies any fall.  She reported she has to concentrate on every step or if she will feel like she is going to fall.  She denies any neuropathy or any numbness and tingling in the lower extremities.  Home medications include Armour for hypothyroidism, Norvasc for hypertension and fish oil.  Denies any family history of Parkinson disease.  She has a MRI brain with no acute intracranial abnormalities but shows evidence of atrophy.     OTHER MEDICAL CONDITIONS: Hypothyroidism and Hypertension    REVIEW OF SYSTEMS: Full 14 system review of systems performed and negative with exception of:  as noted in the  HPI   ALLERGIES: No Known Allergies  HOME MEDICATIONS: Outpatient Medications Prior to Visit  Medication Sig Dispense Refill   amLODipine (NORVASC) 10 MG tablet Take 1 tablet by mouth daily.     Hormone Cream Base CREA Apply topically  daily.      Omega-3 Fatty Acids (FISH OIL) 1000 MG CAPS Take 2 capsules by mouth daily.      thyroid (ARMOUR) 60 MG tablet Take 60 mg by mouth daily. Take with 45m tablet     No facility-administered medications prior to visit.    PAST MEDICAL HISTORY: Past Medical History:  Diagnosis Date   Allergic rhinitis, seasonal    Asthma    CHILDHOOD   Hypothyroidism    Sleep apnea     PAST SURGICAL HISTORY: Past Surgical History:  Procedure Laterality Date   HEMORRHOID SURGERY     x2 polyp   NASAL SEPTUM SURGERY     TONSILLECTOMY AND ADENOIDECTOMY      FAMILY HISTORY: Family History  Problem Relation Age of Onset   Dementia Mother    Other Father        parkinson's disease    SOCIAL HISTORY: Social History   Socioeconomic History   Marital status: Married    Spouse name: Not on file   Number of children: 0   Years of education: Not on file   Highest education level: Not on file  Occupational History   Not on file  Tobacco Use   Smoking status: Never   Smokeless tobacco: Never  Substance and Sexual Activity   Alcohol use: No   Drug use: No   Sexual activity: Not on file  Other Topics Concern   Not on file  Social History Narrative   Not on file   Social Determinants of Health  Financial Resource Strain: Not on file  Food Insecurity: Not on file  Transportation Needs: Not on file  Physical Activity: Not on file  Stress: Not on file  Social Connections: Not on file  Intimate Partner Violence: Not on file    PHYSICAL EXAM  GENERAL EXAM/CONSTITUTIONAL: Vitals:  Vitals:   11/14/21 1411  BP: 138/77  Pulse: 75  Weight: 175 lb (79.4 kg)  Height: 5' 7" (1.702 m)   Body mass index is 27.41 kg/m. Wt Readings from Last 3 Encounters:  11/14/21 175 lb (79.4 kg)  01/06/21 165 lb 6.4 oz (75 kg)  01/01/20 195 lb 3.2 oz (88.5 kg)   Patient is in no distress; well developed, nourished and groomed; neck is supple  EYES: Pupils round and reactive to light,  Visual fields full to confrontation, Extraocular movements intacts,   MUSCULOSKELETAL: Gait, strength, tone, movements noted in Neurologic exam below  NEUROLOGIC: MENTAL STATUS:      No data to display         awake, alert, oriented to person, place and time recent and remote memory intact normal attention and concentration language fluent, comprehension intact, naming intact fund of knowledge appropriate  CRANIAL NERVE:  2nd, 3rd, 4th, 6th - pupils equal and reactive to light, visual fields full to confrontation, extraocular muscles intact, no nystagmus 5th - facial sensation symmetric 7th - facial strength symmetric 8th - hearing intact 9th - palate elevates symmetrically, uvula midline 11th - shoulder shrug symmetric 12th - tongue protrusion midline Decrease facial expressions  MOTOR:  normal bulk and tone, full strength in the BUE, BLE, no tremors, no rigidity and very mild bradykinesia   SENSORY:  normal and symmetric to light touch, pinprick, vibration in the BUEs and mild decrease vibration in the BLEs   COORDINATION:  finger-nose-finger, fine finger movements normal  REFLEXES:  deep tendon reflexes present and symmetric  GAIT/STATION:  Stoop posture, wide based gait, small stride, decrease arm swing as she uses a cane and turn en bloc    DIAGNOSTIC DATA (LABS, IMAGING, TESTING) - I reviewed patient records, labs, notes, testing and imaging myself where available.  Lab Results  Component Value Date   WBC 5.3 01/07/2015   HGB 14.2 01/07/2015   HCT 43.6 01/07/2015   MCV 81.3 01/07/2015   PLT 208.0 01/07/2015      Component Value Date/Time   NA 140 12/25/2006 0904   K 3.9 12/25/2006 0904   CL 106 12/25/2006 0904   CO2 27 12/25/2006 0904   GLUCOSE 99 12/25/2006 0904   BUN 12 12/25/2006 0904   CREATININE 0.78 12/25/2006 0904   CALCIUM 10.7 (H) 12/25/2006 0904   GFRNONAA >60 12/25/2006 0904   GFRAA  12/25/2006 0904    >60        The eGFR has been  calculated using the MDRD equation. This calculation has not been validated in all clinical   No results found for: "CHOL", "HDL", "[place]", "LDLDIRECT", "TRIG", "CHOLHDL" No results found for: "HGBA1C" No results found for: "VITAMINB12" No results found for: "TSH"  MRI Brain 02/04/2021 1. No acute intracranial abnormality. 2. Cerebral Atrophy (ICD10-G31.9).    ASSESSMENT AND PLAN  71 y.o. year old female with history of hypertension and hypothyroidism who is presenting with gait instability for the past year getting worse and luckily no falls.  On exam patient has a wide-based gait, decreased arm swing, small strides, she does turn en bloc.  She also have decreased facial expressions and very mild bradykinesia,  denies any rigidity and there are no tremors.  She does have evidence of parkinsonism but she is not on any medications that can induce parkinsonism, she does not have the classic tremor and cogwheel rigidity, there are no postural instability, no constipation and denies REM sleep dysfunction.  I will send the patient to physical therapy for gait training but also will order a DaTscan for further evaluation.  I will see her in 3 months for follow-up.   1. Gait abnormality   2. Parkinsonism, unspecified Parkinsonism type [facility])      Patient Instructions  Continue current medications  Referral to physical therapy for gait training  DAT Scan  Follow up in 3 months or sooner if worse   Orders Placed This Encounter  Procedures   Ambulatory referral to Physical Therapy    No orders of the defined types were placed in this encounter.   Return in about 3 months (around 02/14/2022).   I have spent a total of 70 minutes dedicated to this patient today, preparing to see patient, performing a medically appropriate examination and evaluation, ordering tests and/or medications and procedures, and counseling and educating the patient/family/caregiver; independently interpreting  result and communicating results to the family/patient/caregiver; and documenting clinical information in the electronic medical record.   Alric Ran, MD 11/14/2021, 5:16 PM  [facility] [address] [place], [place] [address] [phone]

## 2021-11-15 ENCOUNTER — Telehealth: Payer: Self-pay | Admitting: Neurology

## 2021-11-15 NOTE — Telephone Encounter (Signed)
medicare NPR sent to Nuclear Medicine at [facility]

## 2021-11-17 NOTE — Therapy (Signed)
OUTPATIENT PHYSICAL THERAPY NEURO EVALUATION   Patient Name: Emily Kelley MRN: [id number] DOB:1950/12/10, 71 y.o., female Today's Date: 11/18/2021   PCP: London Pepper, MD REFERRING PROVIDER: Alric Ran, MD    PT End of Session - 11/18/21 1148     Visit Number 1    Number of Visits 13    Date for PT Re-Evaluation 12/30/21    Authorization Type Medicare    PT Start Time 1104    PT Stop Time 1147    PT Time Calculation (min) 43 min    Equipment Utilized During Treatment Gait belt    Activity Tolerance Patient tolerated treatment well    Behavior During Therapy Anxious             Past Medical History:  Diagnosis Date   Allergic rhinitis, seasonal    Asthma    CHILDHOOD   Hypothyroidism    Sleep apnea    Past Surgical History:  Procedure Laterality Date   HEMORRHOID SURGERY     x2 polyp   NASAL SEPTUM SURGERY     TONSILLECTOMY AND ADENOIDECTOMY     Patient Active Problem List   Diagnosis Date Noted   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   Hypothyroidism 12/29/2009   Seasonal and perennial allergic rhinitis 01/06/2008   Obstructive sleep apnea 01/06/2008    ONSET DATE: 1 year and 3 months   REFERRING DIAG: R26.9 (ICD-10-CM) - Gait abnormality  THERAPY DIAG:  Unsteadiness on feet  Other abnormalities of gait and mobility  Dizziness and giddiness  Rationale for Evaluation and Treatment Rehabilitation  SUBJECTIVE:                                                                                                                                                                                              SUBJECTIVE STATEMENT: Patient reports that in Spring 2022 she suddenly developed severe eczema, lightheadedness, and dairy allergy. This improved with change in diet. Sudden feelings of LOB continue to occur. Started walking with a quad cane about a year ago but transitioned to RW a coupe days ago d/t feeling like her balance continues to get worse. 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 Denies vertigo currently but has a hx of vertigo and sensation of spinning when waking up in the morning 6 months ago which has since resolved. Has not seen ENT but was referred here by Neurologist. Notes that her MRI was clear.    Pt accompanied by: self  PERTINENT HISTORY: asthma, hypothyroidism, parkinsonism   PAIN:  Are you having pain? No  PRECAUTIONS: Fall  WEIGHT BEARING RESTRICTIONS No  FALLS: Has patient fallen in last 6 months? No  LIVING ENVIRONMENT: Lives with: lives with their spouse Lives in: House/apartment Stairs: No Has following equipment at home: Lobbyist, Environmental consultant - 2 wheeled, and Grab bars  PLOF: Independent  PATIENT GOALS improve balance and dizziness   OBJECTIVE:   DIAGNOSTIC FINDINGS: pt reports her recent MRI was clear   COGNITION: Overall cognitive status: Within functional limits for tasks assessed   SENSATION: WFL  COORDINATION: Intact alt pronation/supination and finger to nose    POSTURE: forward head, rounded shoulder and thoracic spine; forward trunk flexion   LOWER EXTREMITY ROM:     Active  Right Eval Left Eval  Hip flexion    Hip extension    Hip abduction    Hip adduction    Hip internal rotation    Hip external rotation    Knee flexion    Knee extension    Ankle dorsiflexion 15 7  Ankle plantarflexion    Ankle inversion    Ankle eversion     (Blank rows = not tested)  LOWER EXTREMITY MMT:    MMT (in sitting) Right Eval Left Eval  Hip flexion 4+ 4+  Hip extension    Hip abduction 4 4+  Hip adduction 4+ 4+  Hip internal rotation    Hip external rotation    Knee flexion 4- 4+  Knee extension 4- 4+  Ankle dorsiflexion 4 4+  Ankle plantarflexion 4+ 4+  Ankle inversion    Ankle eversion    (Blank rows = not tested)  GAIT: Gait pattern:  Short steps without heel strike and audible shuffling; slow; trunk flexed   Assistive device utilized:  Environmental consultant - 2 wheeled   FUNCTIONAL TESTs:    [facility] PT Assessment - 11/18/21 0001       Standardized Balance Assessment   Standardized Balance Assessment Berg Balance Test;Five Times Sit to Stand;Timed Up and Go Test    Five times sit to stand comments  17.1   without UEs; slightly limited eccentric lower     Berg Balance Test   Sit to Stand Able to stand without using hands and stabilize independently    Standing Unsupported Able to stand safely 2 minutes    Sitting with Back Unsupported but Feet Supported on Floor or Stool Able to sit safely and securely 2 minutes    Stand to Sit Sits safely with minimal use of hands    Transfers Able to transfer safely, minor use of hands    Standing Unsupported with Eyes Closed Able to stand 10 seconds safely    Standing Unsupported with Feet Together Able to place feet together independently and stand for 1 minute with supervision    From Standing, Reach Forward with Outstretched Arm Can reach forward >12 cm safely (5")    From Standing Position, Pick up Object from Floor Able to pick up shoe, needs supervision    From Standing Position, Turn to Look Behind Over each Shoulder Turn sideways only but maintains balance    Turn 360 Degrees Needs close supervision or verbal cueing    Standing Unsupported, Alternately Place Feet on Step/Stool Able to stand independently and complete 8 steps >20 seconds    Standing Unsupported, One Foot in [employer] balance while stepping or standing    Standing on One Leg Unable to try or needs assist to prevent fall    Total Score 39      Timed Up and Go Test  Normal TUG (seconds) 24.35   with RW            PATIENT EDUCATION: Education details: prognosis, POC, HEP; edu on importance of increasing balance confidence on function  Person educated: Patient Education method: Explanation, Demonstration, Tactile cues, Verbal cues, and Handouts Education comprehension: verbalized understanding and returned  demonstration   HOME EXERCISE PROGRAM: Access Code: [email or web address] URL: [email or web address] Date: 11/18/2021 Prepared by: [facility]  Exercises - Mini Squat with Counter Support  - 1 x daily - 5 x weekly - 2 sets - 10 reps - Wide Stance with Counter Support  - 1 x daily - 5 x weekly - 2-3 sets - 10 reps - 30 sec hold - Narrow Stance with Counter Support  - 1 x daily - 5 x weekly - 2-3 sets - 30 sec hold - Standing Toe Taps  - 1 x daily - 5 x weekly - 2 sets - 10 reps - Seated Hamstring Curl with Anchored Resistance  - 1 x daily - 5 x weekly - 2 sets - 10 reps    GOALS: Goals reviewed with patient? Yes  SHORT TERM GOALS: Target date: 12/09/2021  Patient to be independent with initial HEP. Baseline: HEP initiated Goal status: INITIAL    LONG TERM GOALS: Target date: 12/30/2021  Patient to be independent with advanced HEP. Baseline: Not yet initiated  Goal status: INITIAL  Patient to demonstrate R LE strength >/=4+/5.  Baseline: See above Goal status: INITIAL  Patient to report 70% improvement in balance confidence.  Baseline: reports poor balance confidence  Goal status: INITIAL  Patient to complete TUG in <14 sec with LRAD in order to decrease risk of falls.   Baseline: 24.35 sec with RW Goal status: INITIAL  Patient to demonstrate 5xSTS test in <15 sec in order to decrease risk of falls.  Baseline: 17.1 sec  Goal status: INITIAL  Patient to score at least 45/56 on Berg in order to decrease risk of falls.  Baseline: 39/56 Goal status: INITIAL   ASSESSMENT:  CLINICAL IMPRESSION:  Patient is a 71 y/o F presenting to OPPT with c/o progressive imbalance for the past >1 year. Patient initially began ambulating with a quad cane d/t this complaint; recently began using RW d/t feeling of progressive decline. Denies falls. Reports hx of vertigo 6 months ago which has since resolved, however patient reports feelings of LOB  which she believes are vertigo-related. Patient today presenting with R LE weakness, decreased gait speed, gait deviations, decreased balance and balance confidence. Patient's scores on TUG and Berg indicate an increased risk of falls. Plan for vestibular assessment next session. Patient was educated on gentle balance HEP and reported understanding. Would benefit from skilled PT services 2 x/week for 6 weeks to address aforementioned impairments in order to optimize level of function.    OBJECTIVE IMPAIRMENTS Abnormal gait, decreased activity tolerance, decreased balance, decreased strength, dizziness, and postural dysfunction.   ACTIVITY LIMITATIONS carrying, lifting, bending, standing, squatting, transfers, bathing, toileting, dressing, reach over head, hygiene/grooming, and caring for others  PARTICIPATION LIMITATIONS: meal prep, cleaning, laundry, shopping, community activity, and church  PERSONAL FACTORS Age, Behavior pattern, Fitness, Past/current experiences, Time since onset of injury/illness/exacerbation, and 3+ comorbidities: asthma, hypothyroidism, parkinsonism   are also affecting patient's functional outcome.   REHAB POTENTIAL: Good  CLINICAL DECISION MAKING: Evolving/moderate complexity  EVALUATION COMPLEXITY: Moderate  PLAN: PT FREQUENCY: 2x/week  PT DURATION: 6 weeks  PLANNED INTERVENTIONS: Therapeutic exercises, Therapeutic  activity, Neuromuscular re-education, Balance training, Gait training, Patient/Family education, Self Care, Joint mobilization, Stair training, Vestibular training, Canalith repositioning, DME instructions, Aquatic Therapy, Dry Needling, Cryotherapy, Moist heat, Taping, Manual therapy, and Re-evaluation  PLAN FOR NEXT SESSION: vestibular assessment; reassess HEP; progress low level balance activities while weaning UE support to improve balance confidence    Janene Harvey, PT, DPT 11/18/21 12:01 PM  [facility] [address], [place], [place] [address] Phone # [phone] Fax # [phone]

## 2021-11-18 ENCOUNTER — Other Ambulatory Visit: Payer: Self-pay

## 2021-11-18 ENCOUNTER — Ambulatory Visit: Payer: Medicare Other | Attending: Neurology | Admitting: Physical Therapy

## 2021-11-18 ENCOUNTER — Encounter: Payer: Self-pay | Admitting: Physical Therapy

## 2021-11-18 ENCOUNTER — Ambulatory Visit: Payer: 59 | Admitting: Interventional Cardiology

## 2021-11-18 DIAGNOSIS — R2681 Unsteadiness on feet: Secondary | ICD-10-CM | POA: Diagnosis not present

## 2021-11-18 DIAGNOSIS — R42 Dizziness and giddiness: Secondary | ICD-10-CM | POA: Insufficient documentation

## 2021-11-18 DIAGNOSIS — R2689 Other abnormalities of gait and mobility: Secondary | ICD-10-CM | POA: Diagnosis present

## 2021-11-18 DIAGNOSIS — R269 Unspecified abnormalities of gait and mobility: Secondary | ICD-10-CM | POA: Diagnosis not present

## 2021-11-18 NOTE — Progress Notes (Signed)
Subjective:    Patient ID: Emily Kelley, female    DOB: [birth year], 71 y.o.   MRN: [id number]  HPI F followed for asthma, allergic rhinitis, OSA, complicated by hypothyroidism, hx parathyroidectomy, NPSG 01/19/1999 [place], [place]) AHI 14.95/hour, desaturation to 86%, body weight 194 pounds ---------------------------------------------------------------------------   01/06/21- 71 year old female never smoker followed for OSA, complicated by hx asthma, allergic rhinitis, urticaria,  hypothyroidism/ parathyroidectomy,  CPAP auto 5-15/[facility] Download-compliance 100%, AHI 4.1/ hr Body weight today-165 lbs Covid vax-2 Phizer    I suggested she get updated booster Flu vax-declined She is comfortable with her CPAP, won't sleep or nap without it. Download reviewed. She states her insurance will pay to replace machine at 3 years and she asks for replacement now- discussed supply chain delays.  Asthma- childhood only- last acute was at age 89.   11/21/21- 71 year old female never smoker followed for OSA, complicated by hx asthma, allergic rhinitis, urticaria,  hypothyroidism/ parathyroidectomy, Parkinson's,  CPAP auto 5-15/[facility] Download-compliance 67%. AHI 3.8/ hr Body weight today-172 lbs Covid vax-2 Phizer  Being eval by Neurology for abnormal gait and parkinson's. 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  This impacts her sleep.  She has also developed balance problems.  Uses CPAP every night.  Recording stopped as of August 4.  Review of Systems- see HPI   + = positive Constitutional:    no- night sweats, fevers, chills, fatigue, lassitude. HEENT:   No-  headaches, difficulty swallowing, tooth/dental problems, sore throat,        sneezing, itching, ear ache, nasal congestion, post nasal drip,  CV:  No-   chest pain, orthopnea, PND, swelling in lower extremities, anasarca,  dizziness, palpitations Resp: No-    shortness of breath with exertion or at rest.              No-   productive cough,  No non-productive cough,  No-  coughing up of blood.              No-   change in color of mucus.  No- wheezing.   Skin: No rash GI:  No-   heartburn, indigestion, abdominal pain, nausea, vomiting,  GU:  MS:  No-   joint pain or swelling. Marland Kitchen Psych:  No- change in mood or affect. No depression or anxiety.  No memory loss.   Objective:   Physical Exam-  General- Alert, Oriented, Affect-appropriate, Distress- none acute, + rolling walker Skin- Clear Lymphadenopathy- none Head- atraumatic            Eyes- Gross vision intact, PERRLA, conjunctivae clear secretions..            Ears- Hearing, canals normal            Nose- clear, no-Septal dev, mucus, polyps, erosion, perforation             Throat- Mallampati III-IV , mucosa clear , drainage- none, tonsils- atrophic Neck- flexible , trachea midline, no stridor , thyroid nl, carotid no bruit Chest - symmetrical excursion , unlabored           Heart/CV- RRR , no murmur , no gallop  , no rub, nl s1 s2                           - JVD- none , edema- none, stasis changes- none, varices- none  Lung- clear to P&A, wheeze- none, cough- none , dullness-none, rub- none           Chest wall-  Abd-  Br/ Gen/ Rectal- Not done, not indicated Extrem- cyanosis- none, clubbing, none, atrophy- none, strength- nl Neuro- grossly intact to observation    Assessment & Plan:

## 2021-11-18 NOTE — Therapy (Signed)
OUTPATIENT PHYSICAL THERAPY NEURO TREATMENT   Patient Name: Emily Kelley MRN: [id number] DOB:1950-11-07, 71 y.o., female Today's Date: 11/21/2021   PCP: London Pepper, MD REFERRING PROVIDER: Alric Ran, MD    PT End of Session - 11/21/21 1403     Visit Number 2    Number of Visits 13    Date for PT Re-Evaluation 12/30/21    Authorization Type Medicare    PT Start Time 1318    PT Stop Time 1400    PT Time Calculation (min) 42 min    Activity Tolerance Patient tolerated treatment well    Behavior During Therapy Anxious              Past Medical History:  Diagnosis Date   Allergic rhinitis, seasonal    Asthma    CHILDHOOD   HTN (hypertension)    Hypercalcemia    Hypothyroidism    Osteopenia    Sleep apnea    Past Surgical History:  Procedure Laterality Date   CATARACT EXTRACTION     HEMORRHOID SURGERY     x2 polyp   NASAL SEPTUM SURGERY     PARATHYROID EXPLORATION     RETINAL DETACHMENT SURGERY     TONSILLECTOMY AND ADENOIDECTOMY     Patient Active Problem List   Diagnosis Date Noted   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   Hypothyroidism 12/29/2009   Seasonal and perennial allergic rhinitis 01/06/2008   Obstructive sleep apnea 01/06/2008    ONSET DATE: 1 year and 3 months   REFERRING DIAG: R26.9 (ICD-10-CM) - Gait abnormality  THERAPY DIAG:  Unsteadiness on feet  Other abnormalities of gait and mobility  Dizziness and giddiness  Rationale for Evaluation and Treatment Rehabilitation  SUBJECTIVE:                                                                                                                                                                                              SUBJECTIVE STATEMENT: Doing good today. Reports that she had dizziness 3 mornings out of the week when she first woke up and resolved in a couple weeks. Denies any feelings of dizziness, spinning, wooziness since about 6 months ago. Denies head trauma,  infection/illness, vision changes/double vision, hearing loss, tinnitus, otalgia, photo/phonophobia. Denies migraines. Feels like she is a little stopped up now, but does not get seasonal allergies.   Pt accompanied by: self  PERTINENT HISTORY: asthma, hypothyroidism, parkinsonism   PAIN:  Are you having pain? No  PRECAUTIONS: Fall  PATIENT GOALS improve balance and dizziness   OBJECTIVE:   TODAY'S TREATMENT: 11/21/21  VESTIBULAR ASSESSMENT   GENERAL OBSERVATION: patient is  wearing progressive lenses and wears them for reading/computer work    OCULOMOTOR EXAM:   Ocular Alignment: normal   Ocular ROM: No Limitations   Spontaneous Nystagmus: absent   Gaze-Induced Nystagmus: absent   Smooth Pursuits: intact   Saccades: intact   Convergence/Divergence: ~1 inch    VESTIBULAR - OCULAR REFLEX:    Slow VOR: Normal vertical and horizontal    VOR Cancellation: Normal   Head-Impulse Test: HIT Right: negative HIT Left: negative    POSITIONAL TESTING:  Right Roll Test: negative; c/o mild dizziness upon rolling back into supine  Left Roll Test: negative Right Dix-Hallpike: R upbeating torsional nystagmus ; Duration:25 sec Left Dix-Hallpike: NT; Duration: NT    Activity Comments  R Epley  Tolerated well  R DH Negative   Review of HEP:  Mini Squat with Counter Support 10x Wide Stance without Counter Support 30" Narrow Stance without Counter Support 30" Standing Toe Taps 20x  Seated Hamstring Curl 10x each Cues to shift hips back with squats   Sidestepping along counter Cues to take larger steps   Backwards walking along counter Cues to take larger steps        HOME EXERCISE PROGRAM Last updated: 11/21/21 Access Code: [email or web address] URL: [email or web address] Date: 11/21/2021 Prepared by: [place] with Counter Support  - 1 x daily - 5 x weekly - 2 sets - 10 reps - Wide Stance with Counter Support   - 1 x daily - 5 x weekly - 2-3 sets - 10 reps - 30 sec hold - Narrow Stance with Counter Support  - 1 x daily - 5 x weekly - 2-3 sets - 30 sec hold - Standing Toe Taps  - 1 x daily - 5 x weekly - 2 sets - 10 reps - Seated Hamstring Curl with Anchored Resistance  - 1 x daily - 5 x weekly - 2 sets - 10 reps - Side Stepping with Counter Support  - 1 x daily - 5 x weekly - 2 sets - 10 reps - Backward Walking with Counter Support  - 1 x daily - 5 x weekly - 2 sets - 10 reps    PATIENT EDUCATION: Education details: edu on vestibular exam findings, HEP update, edu on vesibular labyrinth anatomy  Person educated: Patient Education method: Explanation, Demonstration, Tactile cues, Verbal cues, and Handouts Education comprehension: verbalized understanding and returned demonstration   Below measures were taken at time of initial evaluation unless otherwise specified:  DIAGNOSTIC FINDINGS: pt reports her recent MRI was clear   COGNITION: Overall cognitive status: Within functional limits for tasks assessed   SENSATION: WFL  COORDINATION: Intact alt pronation/supination and finger to nose    POSTURE: forward head, rounded shoulder and thoracic spine; forward trunk flexion   LOWER EXTREMITY ROM:     Active  Right Eval Left Eval  Hip flexion    Hip extension    Hip abduction    Hip adduction    Hip internal rotation    Hip external rotation    Knee flexion    Knee extension    Ankle dorsiflexion 15 7  Ankle plantarflexion    Ankle inversion    Ankle eversion     (Blank rows = not tested)  LOWER EXTREMITY MMT:    MMT (in sitting) Right Eval Left Eval  Hip flexion 4+ 4+  Hip extension    Hip abduction 4 4+  Hip adduction 4+  4+  Hip internal rotation    Hip external rotation    Knee flexion 4- 4+  Knee extension 4- 4+  Ankle dorsiflexion 4 4+  Ankle plantarflexion 4+ 4+  Ankle inversion    Ankle eversion    (Blank rows = not tested)  GAIT: Gait pattern:  Short  steps without heel strike and audible shuffling; slow; trunk flexed   Assistive device utilized: Environmental consultant - 2 wheeled   FUNCTIONAL TESTs:      PATIENT EDUCATION: Education details: prognosis, POC, HEP; edu on importance of increasing balance confidence on function  Person educated: Patient Education method: Explanation, Demonstration, Tactile cues, Verbal cues, and Handouts Education comprehension: verbalized understanding and returned demonstration   HOME EXERCISE PROGRAM: Access Code: [email or web address] URL: [email or web address] Date: 11/18/2021 Prepared by: [facility]  Exercises - Mini Squat with Counter Support  - 1 x daily - 5 x weekly - 2 sets - 10 reps - Wide Stance with Counter Support  - 1 x daily - 5 x weekly - 2-3 sets - 10 reps - 30 sec hold - Narrow Stance with Counter Support  - 1 x daily - 5 x weekly - 2-3 sets - 30 sec hold - Standing Toe Taps  - 1 x daily - 5 x weekly - 2 sets - 10 reps - Seated Hamstring Curl with Anchored Resistance  - 1 x daily - 5 x weekly - 2 sets - 10 reps    GOALS: Goals reviewed with patient? Yes  SHORT TERM GOALS: Target date: 12/09/2021  Patient to be independent with initial HEP. Baseline: HEP initiated Goal status: IN PROGRESS    LONG TERM GOALS: Target date: 12/30/2021  Patient to be independent with advanced HEP. Baseline: Not yet initiated  Goal status: IN PROGRESS  Patient to demonstrate R LE strength >/=4+/5.  Baseline: See above Goal status: IN PROGRESS  Patient to report 70% improvement in balance confidence.  Baseline: reports poor balance confidence  Goal status: IN PROGRESS  Patient to complete TUG in <14 sec with LRAD in order to decrease risk of falls.   Baseline: 24.35 sec with RW Goal status: IN PROGRESS  Patient to demonstrate 5xSTS test in <15 sec in order to decrease risk of falls.  Baseline: 17.1 sec  Goal status: IN PROGRESS  Patient to score at least  45/56 on Berg in order to decrease risk of falls.  Baseline: 39/56 Goal status: IN PROGRESS   ASSESSMENT:  CLINICAL IMPRESSION:  Patient arrived to session without complaints. Vestibular assessment today revealed negative oculomotor exam and R posterior canalithiasis. Patient tolerated R Epley with subsequent R DH negative upon re-test. Reviewed HEP which revealed improved balance and confidence. Able to initiate more challenging balance activities and updated into HEP. Patient tolerated session well and without complaints upon leaving.     OBJECTIVE IMPAIRMENTS Abnormal gait, decreased activity tolerance, decreased balance, decreased strength, dizziness, and postural dysfunction.   ACTIVITY LIMITATIONS carrying, lifting, bending, standing, squatting, transfers, bathing, toileting, dressing, reach over head, hygiene/grooming, and caring for others  PARTICIPATION LIMITATIONS: meal prep, cleaning, laundry, shopping, community activity, and church  PERSONAL FACTORS Age, Behavior pattern, Fitness, Past/current experiences, Time since onset of injury/illness/exacerbation, and 3+ comorbidities: asthma, hypothyroidism, parkinsonism   are also affecting patient's functional outcome.   REHAB POTENTIAL: Good  CLINICAL DECISION MAKING: Evolving/moderate complexity  EVALUATION COMPLEXITY: Moderate  PLAN: PT FREQUENCY: 2x/week  PT DURATION: 6 weeks  PLANNED INTERVENTIONS: Therapeutic exercises, Therapeutic activity,  Neuromuscular re-education, Balance training, Gait training, Patient/Family education, Self Care, Joint mobilization, Stair training, Vestibular training, Canalith repositioning, DME instructions, Aquatic Therapy, Dry Needling, Cryotherapy, Moist heat, Taping, Manual therapy, and Re-evaluation  PLAN FOR NEXT SESSION: progress low level balance activities while weaning UE support to improve balance confidence    Janene Harvey, PT, DPT 11/21/21 2:04 PM  [facility]  [facility] Re[facility] at [facility] [address], [place], [place] [address] Phone # [phone] Fax # [phone]

## 2021-11-20 ENCOUNTER — Encounter: Payer: Self-pay | Admitting: Internal Medicine

## 2021-11-21 ENCOUNTER — Ambulatory Visit (INDEPENDENT_AMBULATORY_CARE_PROVIDER_SITE_OTHER): Payer: Medicare Other | Admitting: Internal Medicine

## 2021-11-21 ENCOUNTER — Encounter: Payer: Self-pay | Admitting: Internal Medicine

## 2021-11-21 ENCOUNTER — Other Ambulatory Visit: Payer: Self-pay | Admitting: Internal Medicine

## 2021-11-21 ENCOUNTER — Ambulatory Visit: Payer: Medicare Other | Admitting: Physical Therapy

## 2021-11-21 ENCOUNTER — Encounter: Payer: Self-pay | Admitting: Physical Therapy

## 2021-11-21 DIAGNOSIS — R42 Dizziness and giddiness: Secondary | ICD-10-CM

## 2021-11-21 DIAGNOSIS — R2689 Other abnormalities of gait and mobility: Secondary | ICD-10-CM | POA: Diagnosis not present

## 2021-11-21 DIAGNOSIS — G4733 Obstructive sleep apnea (adult) (pediatric): Secondary | ICD-10-CM | POA: Diagnosis not present

## 2021-11-21 DIAGNOSIS — R2681 Unsteadiness on feet: Secondary | ICD-10-CM | POA: Diagnosis not present

## 2021-11-21 NOTE — Patient Instructions (Signed)
Order- DME [facility]  please renew [employer] or provide SD card so we can get downloads   Continue CPAP auto 5-15, mask of choice, humidifier, supplies,  Please call if we can help

## 2021-11-22 NOTE — Therapy (Signed)
OUTPATIENT PHYSICAL THERAPY NEURO TREATMENT   Patient Name: Emily Kelley MRN: [id number] DOB:07-14-1950, 71 y.o., female Today's Date: 11/23/2021   PCP: London Pepper, MD REFERRING PROVIDER: Alric Ran, MD    PT End of Session - 11/23/21 1320     Visit Number 3    Number of Visits 13    Date for PT Re-Evaluation 12/30/21    Authorization Type Medicare    PT Start Time 1231    PT Stop Time 1315    PT Time Calculation (min) 44 min    Equipment Utilized During Treatment Gait belt    Activity Tolerance Patient tolerated treatment well    Behavior During Therapy WFL for tasks assessed/performed               Past Medical History:  Diagnosis Date   Allergic rhinitis, seasonal    Asthma    CHILDHOOD   HTN (hypertension)    Hypercalcemia    Hypothyroidism    Osteopenia    Sleep apnea    Past Surgical History:  Procedure Laterality Date   CATARACT EXTRACTION     HEMORRHOID SURGERY     x2 polyp   NASAL SEPTUM SURGERY     PARATHYROID EXPLORATION     RETINAL DETACHMENT SURGERY     TONSILLECTOMY AND ADENOIDECTOMY     Patient Active Problem List   Diagnosis Date Noted   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   Hypothyroidism 12/29/2009   Seasonal and perennial allergic rhinitis 01/06/2008   Obstructive sleep apnea 01/06/2008    ONSET DATE: 1 year and 3 months   REFERRING DIAG: R26.9 (ICD-10-CM) - Gait abnormality  THERAPY DIAG:  Unsteadiness on feet  Other abnormalities of gait and mobility  Dizziness and giddiness  Rationale for Evaluation and Treatment Rehabilitation  SUBJECTIVE:                                                                                                                                                                                              SUBJECTIVE STATEMENT: Has been working on her HEP. Denies dizziness.   Pt accompanied by: self  PERTINENT HISTORY: asthma, hypothyroidism, parkinsonism   PAIN:  Are you having pain?  No  PRECAUTIONS: Fall  PATIENT GOALS improve balance and dizziness   OBJECTIVE:     TODAY'S TREATMENT: 11/23/21 Activity Comments  R DH Negative   Sidestepping along counter No UE support; cues to take larger steps   backwards walking along counter  Cues to widen BOS; 2 finger support   alt toe tap on cone  Using 1-0 UE support; cues to slow down  standing ant/pos  wt shifts 10x Some hesitancy in posterior directions; cues to avoid bending at hips   staggered stance ant/pos wt shift  B UE support weaning to no UE support; CGA and some hesitancy in posterior direction   Gait with SPC and quad tip cane [id number] Focus on increased R step length to encourage step-through pattern; pt noted improved stability with quad tip     HOME EXERCISE PROGRAM Last updated: 11/23/21 Access Code: [email or web address]: [email or web address] Date: 11/23/2021 [facility] by: [facility]  Exercises - Narrow Stance with Counter Support  - 1 x daily - 5 x weekly - 2-3 sets - 30 sec hold - Standing Toe Taps  - 1 x daily - 5 x weekly - 2 sets - 10 reps - Seated Hamstring Curl with Anchored Resistance  - 1 x daily - 5 x weekly - 2 sets - 10 reps - Side Stepping with Counter Support  - 1 x daily - 5 x weekly - 2 sets - 10 reps - Backward Walking with Counter Support  - 1 x daily - 5 x weekly - 2 sets - 10 reps - Staggered Stance Forward Backward Weight Shift with Counter Support  - 1 x daily - 5 x weekly - 2 sets - 10 reps - Forward Backward Weight Shift with Counter Support  - 1 x daily - 5 x weekly - 2 sets - 10 reps   PATIENT EDUCATION: Education details: edu on quad tip attachment for [facility] encouraged practicing using this with ambulation in the home; HEP update Person educated: Patient Education method: Explanation, Demonstration, Tactile cues, Verbal cues, and Handouts Education comprehension: verbalized understanding and returned  demonstration     Below measures were taken at time of initial evaluation unless otherwise specified:  DIAGNOSTIC FINDINGS: pt reports her recent MRI was clear   COGNITION: Overall cognitive status: Within functional limits for tasks assessed   SENSATION: WFL  COORDINATION: Intact alt pronation/supination and finger to nose    POSTURE: forward head, rounded shoulder and thoracic spine; forward trunk flexion   LOWER EXTREMITY ROM:     Active  Right Eval Left Eval  Hip flexion    Hip extension    Hip abduction    Hip adduction    Hip internal rotation    Hip external rotation    Knee flexion    Knee extension    Ankle dorsiflexion 15 7  Ankle plantarflexion    Ankle inversion    Ankle eversion     (Blank rows = not tested)  LOWER EXTREMITY MMT:    MMT (in sitting) Right Eval Left Eval  Hip flexion 4+ 4+  Hip extension    Hip abduction 4 4+  Hip adduction 4+ 4+  Hip internal rotation    Hip external rotation    Knee flexion 4- 4+  Knee extension 4- 4+  Ankle dorsiflexion 4 4+  Ankle plantarflexion 4+ 4+  Ankle inversion    Ankle eversion    (Blank rows = not tested)  GAIT: Gait pattern:  Short steps without heel strike and audible shuffling; slow; trunk flexed   Assistive device utilized: WEnvironmental consultant- 2 wheeled   FUNCTIONAL TESTs:      PATIENT EDUCATION: Education details: prognosis, POC, HEP; edu on importance of increasing balance confidence on function  Person educated: Patient Education method: Explanation, Demonstration, Tactile cues, Verbal cues, and Handouts Education comprehension: verbalized understanding and returned demonstration   HOME EXERCISE PROGRAM:  Access Code: [email or web address] URL: [email or web address] Date: 11/18/2021 Prepared by: [facility]  Exercises - Mini Squat with Counter Support  - 1 x daily - 5 x weekly - 2 sets - 10 reps - Wide Stance with Counter Support  - 1 x daily  - 5 x weekly - 2-3 sets - 10 reps - 30 sec hold - Narrow Stance with Counter Support  - 1 x daily - 5 x weekly - 2-3 sets - 30 sec hold - Standing Toe Taps  - 1 x daily - 5 x weekly - 2 sets - 10 reps - Seated Hamstring Curl with Anchored Resistance  - 1 x daily - 5 x weekly - 2 sets - 10 reps    GOALS: Goals reviewed with patient? Yes  SHORT TERM GOALS: Target date: 12/09/2021  Patient to be independent with initial HEP. Baseline: HEP initiated Goal status: INITIAL    LONG TERM GOALS: Target date: 12/30/2021  Patient to be independent with advanced HEP. Baseline: Not yet initiated  Goal status: IN PROGRESS  Patient to demonstrate R LE strength >/=4+/5.  Baseline: See above Goal status: IN PROGRESS  Patient to report 70% improvement in balance confidence.  Baseline: reports poor balance confidence  Goal status: IN PROGRESS  Patient to complete TUG in <14 sec with LRAD in order to decrease risk of falls.   Baseline: 24.35 sec with RW Goal status: IN PROGRESS  Patient to demonstrate 5xSTS test in <15 sec in order to decrease risk of falls.  Baseline: 17.1 sec  Goal status: IN PROGRESS  Patient to score at least 45/56 on Berg in order to decrease risk of falls.  Baseline: 39/56 Goal status: IN PROGRESS   ASSESSMENT:  CLINICAL IMPRESSION: Patient arrived to session with report of HEP compliance. R DH still negative and patient reported no dizziness. Reviewed previously administered HEP which revealed improved confidence and step length. Cueing required to widen BOS with dynamic balance exercises to improved stability. Initiated weight shifts with some hesitancy in posterior direction; able to wean UE support after practice. Initiated gait training with can with focus on step through pattern. Patient reported understanding o all edu provided today and without complaints upon leaving.    OBJECTIVE IMPAIRMENTS Abnormal gait, decreased activity tolerance, decreased balance,  decreased strength, dizziness, and postural dysfunction.   ACTIVITY LIMITATIONS carrying, lifting, bending, standing, squatting, transfers, bathing, toileting, dressing, reach over head, hygiene/grooming, and caring for others  PARTICIPATION LIMITATIONS: meal prep, cleaning, laundry, shopping, community activity, and church  PERSONAL FACTORS Age, Behavior pattern, Fitness, Past/current experiences, Time since onset of injury/illness/exacerbation, and 3+ comorbidities: asthma, hypothyroidism, parkinsonism   are also affecting patient's functional outcome.   REHAB POTENTIAL: Good  CLINICAL DECISION MAKING: Evolving/moderate complexity  EVALUATION COMPLEXITY: Moderate  PLAN: PT FREQUENCY: 2x/week  PT DURATION: 6 weeks  PLANNED INTERVENTIONS: Therapeutic exercises, Therapeutic activity, Neuromuscular re-education, Balance training, Gait training, Patient/Family education, Self Care, Joint mobilization, Stair training, Vestibular training, Canalith repositioning, DME instructions, Aquatic Therapy, Dry Needling, Cryotherapy, Moist heat, Taping, Manual therapy, and Re-evaluation  PLAN FOR NEXT SESSION: gait training with quad tip cane; progress low level balance activities while weaning UE support to improve balance confidence    Janene Harvey, PT, DPT 11/23/21 1:23 PM  [facility] at [facility] [address] [place], [place] [address] Phone # [phone] Fax # [phone]

## 2021-11-22 NOTE — Progress Notes (Signed)
Cardiology Office Note:    Date:  11/23/2021   ID:  Emily Kelley, DOB Jul 18, 1950, MRN [id number]  PCP:  London Pepper, MD  Cardiologist:  None   Referring MD: London Pepper, MD   Chief Complaint  Patient presents with   Right bundle branch block    History of Present Illness:    Emily Kelley is a 71 y.o. female with a hx of obstructive sleep apnea, primary hypertension, asthma, and new finding of right bundle branch block.  Referred by Dr. Orland Mustard.   She is referred by Dr. Orland Mustard who was concerned about right bundle branch block on the EKG that was done as a routine screening test during her complete exam.  She has no history of palpitations, syncope, dyspnea, edema, angina, arrhythmia/atrial fib, or other complaints.  She is not diabetic.  Does not smoke cigarettes.  She has controlled hypertension.  She is under percent compliant with her CPAP.  Past Medical History:  Diagnosis Date   Allergic rhinitis, seasonal    Asthma    CHILDHOOD   HTN (hypertension)    Hypercalcemia    Hypothyroidism    Osteopenia    Sleep apnea     Past Surgical History:  Procedure Laterality Date   CATARACT EXTRACTION     HEMORRHOID SURGERY     x2 polyp   NASAL SEPTUM SURGERY     PARATHYROID EXPLORATION     RETINAL DETACHMENT SURGERY     TONSILLECTOMY AND ADENOIDECTOMY      Current Medications: Current Meds  Medication Sig   amLODipine (NORVASC) 10 MG tablet Take 1 tablet by mouth daily.   Glucosamine Sulfate 1000 MG TABS Take 1 tablet by mouth 3 (three) times a week.   Hormone Cream Base CREA Apply topically daily.    Omega-3 Fatty Acids (FISH OIL) 1000 MG CAPS Take 2 capsules by mouth daily.    thyroid (ARMOUR) 60 MG tablet Take 60 mg by mouth daily. Take with '15mg'$  tablet     Allergies:   Patient has no known allergies.   Social History   Socioeconomic History   Marital status: Married    Spouse name: Not on file   Number of children: 0   Years of education: Not  on file   Highest education level: Not on file  Occupational History   Not on file  Tobacco Use   Smoking status: Never   Smokeless tobacco: Never  Substance and Sexual Activity   Alcohol use: No   Drug use: No   Sexual activity: Not on file  Other Topics Concern   Not on file  Social History Narrative   Not on file   Social Determinants of Health   Financial Resource Strain: Not on file  Food Insecurity: Not on file  Transportation Needs: Not on file  Physical Activity: Not on file  Stress: Not on file  Social Connections: Not on file     Family History: The patient's family history includes Dementia in her mother; Other in her father.  ROS:   Please see the history of present illness.    She has difficulty with balance, felt to be related to middle ear/vestibular.  This is being evaluated and treated by ENT.  No family history of heart disease.  Her husband has a pacemaker.  She is not diabetic.  All other systems reviewed and are negative.  EKGs/Labs/Other Studies Reviewed:    The following studies were reviewed today: No prior cardiac imaging or testing  EKG:  EKG a copy of EKG performed by Dr. Orland Mustard is reviewed.  Date of EKG 10/17/2021.  It demonstrates sinus rhythm, normal PR, right bundle branch block, and vertical axis.  No prior tracings for comparison.  Recent Labs: No results found for requested labs within last 365 days.  Recent Lipid Panel No results found for: "CHOL", "TRIG", "HDL", "CHOLHDL", "VLDL", "LDLCALC", "LDLDIRECT"  Physical Exam:    VS:  BP 116/64   Pulse 83   Ht '5\' 7"'$  (1.702 m)   Wt 171 lb 6.4 oz (77.7 kg)   SpO2 97%   BMI 26.85 kg/m     Wt Readings from Last 3 Encounters:  11/23/21 171 lb 6.4 oz (77.7 kg)  11/21/21 172 lb (78 kg)  11/14/21 175 lb (79.4 kg)     GEN: Compatible with age. No acute distress HEENT: Normal NECK: No JVD. LYMPHATICS: No lymphadenopathy CARDIAC: No murmur. RRR no gallop, or edema. VASCULAR:  Normal  Pulses. No bruits. RESPIRATORY:  Clear to auscultation without rales, wheezing or rhonchi  ABDOMEN: Soft, non-tender, non-distended, No pulsatile mass, MUSCULOSKELETAL: No deformity  SKIN: Warm and dry NEUROLOGIC:  Alert and oriented x 3 PSYCHIATRIC:  Normal affect   ASSESSMENT:    1. Right bundle branch block   2. Obstructive sleep apnea    PLAN:    In order of problems listed above:  No symptoms to suggest underlying coronary, structural, functional, or arrhythmia.  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  I would recommend clinical follow-up without further testing at this time.  She is cautioned to notify if palpitations, syncope, or any cardiopulmonary complaints.  She needs to have an EKG repeated at her office visit with Dr. Orland Mustard in 1 year. She is compliant with CPAP.  There is no evidence of pulmonary hypertension or right heart failure on exam.   Medication Adjustments/Labs and Tests Ordered: Current medicines are reviewed at length with the patient today.  Concerns regarding medicines are outlined above.  No orders of the defined types were placed in this encounter.  No orders of the defined types were placed in this encounter.   Patient Instructions  Medication Instructions:  Your physician recommends that you continue on your current medications as directed. Please refer to the Current Medication list given to you today.  *If you need a refill on your cardiac medications before your next appointment, please call your pharmacy*  Lab Work: NONE  Testing/Procedures: NONE  Follow-Up: As needed  Important Information About Sugar         Signed, Sinclair Grooms, MD  11/23/2021 10:05 AM    [place]

## 2021-11-23 ENCOUNTER — Ambulatory Visit: Payer: Medicare Other | Admitting: Physical Therapy

## 2021-11-23 ENCOUNTER — Ambulatory Visit (INDEPENDENT_AMBULATORY_CARE_PROVIDER_SITE_OTHER): Payer: Medicare Other | Admitting: Interventional Cardiology

## 2021-11-23 ENCOUNTER — Encounter: Payer: Self-pay | Admitting: Interventional Cardiology

## 2021-11-23 ENCOUNTER — Encounter: Payer: Self-pay | Admitting: Physical Therapy

## 2021-11-23 VITALS — BP 116/64 | HR 83 | Ht 67.0 in | Wt 171.4 lb

## 2021-11-23 DIAGNOSIS — R2681 Unsteadiness on feet: Secondary | ICD-10-CM

## 2021-11-23 DIAGNOSIS — G4733 Obstructive sleep apnea (adult) (pediatric): Secondary | ICD-10-CM

## 2021-11-23 DIAGNOSIS — I4510 Unspecified right bundle-branch block: Secondary | ICD-10-CM | POA: Diagnosis not present

## 2021-11-23 DIAGNOSIS — R55 Syncope and collapse: Secondary | ICD-10-CM

## 2021-11-23 DIAGNOSIS — R42 Dizziness and giddiness: Secondary | ICD-10-CM

## 2021-11-23 DIAGNOSIS — R2689 Other abnormalities of gait and mobility: Secondary | ICD-10-CM

## 2021-11-23 NOTE — Patient Instructions (Signed)
Medication Instructions:  Your physician recommends that you continue on your current medications as directed. Please refer to the Current Medication list given to you today.  *If you need a refill on your cardiac medications before your next appointment, please call your pharmacy*  Lab Work: NONE  Testing/Procedures: NONE  Follow-Up: As needed  Important Information About Sugar       

## 2021-11-29 ENCOUNTER — Encounter: Payer: Self-pay | Admitting: Physical Therapy

## 2021-11-29 ENCOUNTER — Ambulatory Visit: Payer: Medicare Other | Admitting: Physical Therapy

## 2021-11-29 DIAGNOSIS — R2681 Unsteadiness on feet: Secondary | ICD-10-CM | POA: Diagnosis not present

## 2021-11-29 DIAGNOSIS — R2689 Other abnormalities of gait and mobility: Secondary | ICD-10-CM

## 2021-11-29 NOTE — Therapy (Signed)
OUTPATIENT PHYSICAL THERAPY NEURO TREATMENT   Patient Name: Emily Kelley MRN: [id number] DOB:Oct 16, 1950, 71 y.o., female Today's Date: 12/01/2021   PCP: London Pepper, MD REFERRING PROVIDER: Alric Ran, MD    PT End of Session - 12/01/21 1048     Visit Number 5    Number of Visits 13    Date for PT Re-Evaluation 12/30/21    Authorization Type Medicare    PT Start Time 1010    PT Stop Time 1048    PT Time Calculation (min) 38 min    Equipment Utilized During Treatment Gait belt    Activity Tolerance Patient tolerated treatment well    Behavior During Therapy WFL for tasks assessed/performed                 Past Medical History:  Diagnosis Date   Allergic rhinitis, seasonal    Asthma    CHILDHOOD   HTN (hypertension)    Hypercalcemia    Hypothyroidism    Osteopenia    Sleep apnea    Past Surgical History:  Procedure Laterality Date   CATARACT EXTRACTION     HEMORRHOID SURGERY     x2 polyp   NASAL SEPTUM SURGERY     PARATHYROID EXPLORATION     RETINAL DETACHMENT SURGERY     TONSILLECTOMY AND ADENOIDECTOMY     Patient Active Problem List   Diagnosis Date Noted   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   Hypothyroidism 12/29/2009   Seasonal and perennial allergic rhinitis 01/06/2008   Obstructive sleep apnea 01/06/2008    ONSET DATE: 1 year and 3 months   REFERRING DIAG: R26.9 (ICD-10-CM) - Gait abnormality  THERAPY DIAG:  Unsteadiness on feet  Other abnormalities of gait and mobility  Dizziness and giddiness  Rationale for Evaluation and Treatment Rehabilitation  SUBJECTIVE:                                                                                                                                                                                              SUBJECTIVE STATEMENT: "Religiously doing my exercises." Confidence with the exercises is improving. Still really having to focus on walking with the cane.   Pt accompanied by:  self  PERTINENT HISTORY: asthma, hypothyroidism, parkinsonism   PAIN:  Are you having pain? No  PRECAUTIONS: Fall  PATIENT GOALS improve balance and dizziness   OBJECTIVE:     TODAY'S TREATMENT: 12/01/21 Activity Comments  gait training with quad tip cane indoors and outdoors on sidewalk x 363ft, practicing stepping up and down curbs    CGA; cues for sequencing AD on curbs, cues to look ahead and increase  step length with gait  alt toe tap on 2 cones 15x each Tendency to shift shoulders rather than hips; more difficulty with R LE  standing ant/pos wt shifts on foam 20x  Good response to "make the legs do the work"  step ups on foam 10x each  No LOB; cues to avoid stomping  standing on foam 3x30" Tendency to sway R; cues to maintain core and upper body more rigid      HOME EXERCISE PROGRAM Last updated: 12/01/21 Access Code: [email or web address] URL: [email or web address] Date: 12/01/2021 Prepared by: [facility]  Exercises - Standing Toe Taps  - 1 x daily - 5 x weekly - 2 sets - 10 reps - Seated Hamstring Curl with Anchored Resistance  - 1 x daily - 5 x weekly - 2 sets - 10 reps - Side Stepping with Counter Support  - 1 x daily - 5 x weekly - 2 sets - 10 reps - Backward Walking with Counter Support  - 1 x daily - 5 x weekly - 2 sets - 10 reps - Staggered Stance Forward Backward Weight Shift with Counter Support  - 1 x daily - 5 x weekly - 2 sets - 10 reps - Forward Backward Weight Shift with Counter Support  - 1 x daily - 5 x weekly - 2 sets - 10 reps - Lateral Weight Shift with Arm Raise  - 1 x daily - 5 x weekly - 2 sets - 10 reps - Alternating Side Step  - 1 x daily - 5 x weekly - 2 sets - 10 reps - Romberg Stance on Foam Pad  - 1 x daily - 5 x weekly - 2-3 sets - 30 sec hold   PATIENT EDUCATION: Education details: HEP update and review Person educated: Patient Education method: Explanation, Demonstration, Tactile cues, Verbal  cues, and Handouts Education comprehension: verbalized understanding and returned demonstration    Below measures were taken at time of initial evaluation unless otherwise specified:  DIAGNOSTIC FINDINGS: pt reports her recent MRI was clear   COGNITION: Overall cognitive status: Within functional limits for tasks assessed   SENSATION: WFL  COORDINATION: Intact alt pronation/supination and finger to nose    POSTURE: forward head, rounded shoulder and thoracic spine; forward trunk flexion   LOWER EXTREMITY ROM:     Active  Right Eval Left Eval  Hip flexion    Hip extension    Hip abduction    Hip adduction    Hip internal rotation    Hip external rotation    Knee flexion    Knee extension    Ankle dorsiflexion 15 7  Ankle plantarflexion    Ankle inversion    Ankle eversion     (Blank rows = not tested)  LOWER EXTREMITY MMT:    MMT (in sitting) Right Eval Left Eval  Hip flexion 4+ 4+  Hip extension    Hip abduction 4 4+  Hip adduction 4+ 4+  Hip internal rotation    Hip external rotation    Knee flexion 4- 4+  Knee extension 4- 4+  Ankle dorsiflexion 4 4+  Ankle plantarflexion 4+ 4+  Ankle inversion    Ankle eversion    (Blank rows = not tested)  GAIT: Gait pattern:  Short steps without heel strike and audible shuffling; slow; trunk flexed   Assistive device utilized: Environmental consultant - 2 wheeled   FUNCTIONAL TESTs:      PATIENT EDUCATION: Education details: prognosis,  POC, HEP; edu on importance of increasing balance confidence on function  Person educated: Patient Education method: Explanation, Demonstration, Tactile cues, Verbal cues, and Handouts Education comprehension: verbalized understanding and returned demonstration   HOME EXERCISE PROGRAM: Access Code: [email or web address] URL: [email or web address] Date: 11/18/2021 Prepared by: [facility]  Exercises - Mini Squat with Counter Support  - 1 x daily  - 5 x weekly - 2 sets - 10 reps - Wide Stance with Counter Support  - 1 x daily - 5 x weekly - 2-3 sets - 10 reps - 30 sec hold - Narrow Stance with Counter Support  - 1 x daily - 5 x weekly - 2-3 sets - 30 sec hold - Standing Toe Taps  - 1 x daily - 5 x weekly - 2 sets - 10 reps - Seated Hamstring Curl with Anchored Resistance  - 1 x daily - 5 x weekly - 2 sets - 10 reps    GOALS: Goals reviewed with patient? Yes  SHORT TERM GOALS: Target date: 12/09/2021  Patient to be independent with initial HEP. Baseline: HEP initiated Goal status: MET    LONG TERM GOALS: Target date: 12/30/2021  Patient to be independent with advanced HEP. Baseline: Not yet initiated  Goal status: IN PROGRESS  Patient to demonstrate R LE strength >/=4+/5.  Baseline: See above Goal status: IN PROGRESS  Patient to report 70% improvement in balance confidence.  Baseline: reports poor balance confidence  Goal status: IN PROGRESS  Patient to complete TUG in <14 sec with LRAD in order to decrease risk of falls.   Baseline: 24.35 sec with RW Goal status: IN PROGRESS  Patient to demonstrate 5xSTS test in <15 sec in order to decrease risk of falls.  Baseline: 17.1 sec  Goal status: IN PROGRESS  Patient to score at least 45/56 on Berg in order to decrease risk of falls.  Baseline: 39/56 Goal status: IN PROGRESS   ASSESSMENT:  CLINICAL IMPRESSION: Patient arrived to session without complaints. Worked on gait straining with quad tip cane with patient demonstrating good improvement in cane sequencing. Also worked on Runner, broadcasting/film/video with good stability. Patient still more hesitant and with tendency to shorted step length when looking ahead rather than down at feet with ambulation. More challenging balance activities in parallel bars were performed with minimal UE support. Patient also with excellent improvement in self-balance recovery without increase in anxiety or premature reaching strategy required. Update  and reviewed HEP with exercises that were well-tolerated today. Patient reported understanding and without complaints upon leaving.   OBJECTIVE IMPAIRMENTS Abnormal gait, decreased activity tolerance, decreased balance, decreased strength, dizziness, and postural dysfunction.   ACTIVITY LIMITATIONS carrying, lifting, bending, standing, squatting, transfers, bathing, toileting, dressing, reach over head, hygiene/grooming, and caring for others  PARTICIPATION LIMITATIONS: meal prep, cleaning, laundry, shopping, community activity, and church  PERSONAL FACTORS Age, Behavior pattern, Fitness, Past/current experiences, Time since onset of injury/illness/exacerbation, and 3+ comorbidities: asthma, hypothyroidism, parkinsonism   are also affecting patient's functional outcome.   REHAB POTENTIAL: Good  CLINICAL DECISION MAKING: Evolving/moderate complexity  EVALUATION COMPLEXITY: Moderate  PLAN: PT FREQUENCY: 2x/week  PT DURATION: 6 weeks  PLANNED INTERVENTIONS: Therapeutic exercises, Therapeutic activity, Neuromuscular re-education, Balance training, Gait training, Patient/Family education, Self Care, Joint mobilization, Stair training, Vestibular training, Canalith repositioning, DME instructions, Aquatic Therapy, Dry Needling, Cryotherapy, Moist heat, Taping, Manual therapy, and Re-evaluation  PLAN FOR NEXT SESSION: Continue gait training outside with quad tip cane; progress balance activities while  weaning UE support to improve balance confidence -including hip/ankle/step strategies   Janene Harvey, PT, DPT 12/01/21 10:52 AM  [facility] at [facility] [place], [place], [place] [address] Phone # [phone] Fax # [phone]

## 2021-11-29 NOTE — Therapy (Signed)
OUTPATIENT PHYSICAL THERAPY NEURO TREATMENT   Patient Name: Emily Kelley MRN: [id number] DOB:25-Sep-1950, 71 y.o., female Today's Date: 11/29/2021   PCP: London Pepper, MD REFERRING PROVIDER: Alric Ran, MD    PT End of Session - 11/29/21 1052     Visit Number 4    Number of Visits 13    Date for PT Re-Evaluation 12/30/21    Authorization Type Medicare    PT Start Time 1058    PT Stop Time 1142    PT Time Calculation (min) 44 min    Equipment Utilized During Treatment Gait belt    Activity Tolerance Patient tolerated treatment well    Behavior During Therapy WFL for tasks assessed/performed                Past Medical History:  Diagnosis Date   Allergic rhinitis, seasonal    Asthma    CHILDHOOD   HTN (hypertension)    Hypercalcemia    Hypothyroidism    Osteopenia    Sleep apnea    Past Surgical History:  Procedure Laterality Date   CATARACT EXTRACTION     HEMORRHOID SURGERY     x2 polyp   NASAL SEPTUM SURGERY     PARATHYROID EXPLORATION     RETINAL DETACHMENT SURGERY     TONSILLECTOMY AND ADENOIDECTOMY     Patient Active Problem List   Diagnosis Date Noted   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   Hypothyroidism 12/29/2009   Seasonal and perennial allergic rhinitis 01/06/2008   Obstructive sleep apnea 01/06/2008    ONSET DATE: 1 year and 3 months   REFERRING DIAG: R26.9 (ICD-10-CM) - Gait abnormality  THERAPY DIAG:  Unsteadiness on feet  Other abnormalities of gait and mobility  Rationale for Evaluation and Treatment Rehabilitation  SUBJECTIVE:                                                                                                                                                                                              SUBJECTIVE STATEMENT: Have not fallen.  Still using the walker.  Working on the exercises at home.  Pt accompanied by: self  PERTINENT HISTORY: asthma, hypothyroidism, parkinsonism   PAIN:  Are you having  pain? No  PRECAUTIONS: Fall  PATIENT GOALS improve balance and dizziness   OBJECTIVE:    TODAY'S TREATMENT: 11/29/2021 Activity Comments  Reviewed exercises as part of recent addition to HEP: Ant/posterior weightshift x 10 Stagger stance weightshift forward and back, x 10 Sidestepping along counter    Backward stepping along counter   Good form  Cues to "hinge at hips" in the backward direction Cues to  take fewer, bigger steps, as strategy for larger steps (initially took 6 steps, able to take 5 with cues) Cues for wider BOS, more space between feet  Wide BOS lateral weightshifting, 2 x 10 reps With addition of lateral reaching, improved activation through hip for stability  Side step and weightshift for step strategy, 2 x 10 reps Progressing to alternating sides, no UE support  Gait with RW 40 ft x 4 with supervision Cues for heelstrike, for increased step length  Gait with cane with small rubber quad tip, 80 ft x 2, additional 20 ft x 2 Cues for "normal walking and you happen to have a cane to assist"  Forward step and weightshift x 10 reps, then back step and weightshift, x 10 reps alternating legs, for improved step strategy Cues for widened BOS upon return to midline ("feet in the sand")    Access Code: [email or web address] URL: [email or web address] Date: 11/29/2021-most recent additions to HEP Prepared by: [facility]  Exercises - Narrow Stance with Counter Support  - 1 x daily - 5 x weekly - 2-3 sets - 30 sec hold - Standing Toe Taps  - 1 x daily - 5 x weekly - 2 sets - 10 reps - Seated Hamstring Curl with Anchored Resistance  - 1 x daily - 5 x weekly - 2 sets - 10 reps - Side Stepping with Counter Support  - 1 x daily - 5 x weekly - 2 sets - 10 reps - Backward Walking with Counter Support  - 1 x daily - 5 x weekly - 2 sets - 10 reps - Staggered Stance Forward Backward Weight Shift with Counter Support  - 1 x daily - 5 x weekly  - 2 sets - 10 reps - Forward Backward Weight Shift with Counter Support  - 1 x daily - 5 x weekly - 2 sets - 10 reps - Lateral Weight Shift with Arm Raise  - 1 x daily - 5 x weekly - 2 sets - 10 reps - Alternating Side Step  - 1 x daily - 5 x weekly - 2 sets - 10 reps   PATIENT EDUCATION: Education details: HEP additions/review Person educated: Patient Education method: Consulting civil engineer, Demonstration, Verbal cues, and Handouts Education comprehension: verbalized understanding and returned demonstration   Below measures were taken at time of initial evaluation unless otherwise specified:  DIAGNOSTIC FINDINGS: pt reports her recent MRI was clear   COGNITION: Overall cognitive status: Within functional limits for tasks assessed   SENSATION: WFL  COORDINATION: Intact alt pronation/supination and finger to nose    POSTURE: forward head, rounded shoulder and thoracic spine; forward trunk flexion   LOWER EXTREMITY ROM:     Active  Right Eval Left Eval  Hip flexion    Hip extension    Hip abduction    Hip adduction    Hip internal rotation    Hip external rotation    Knee flexion    Knee extension    Ankle dorsiflexion 15 7  Ankle plantarflexion    Ankle inversion    Ankle eversion     (Blank rows = not tested)  LOWER EXTREMITY MMT:    MMT (in sitting) Right Eval Left Eval  Hip flexion 4+ 4+  Hip extension    Hip abduction 4 4+  Hip adduction 4+ 4+  Hip internal rotation    Hip external rotation    Knee flexion 4- 4+  Knee extension 4- 4+  Ankle dorsiflexion 4 4+  Ankle plantarflexion 4+ 4+  Ankle inversion    Ankle eversion    (Blank rows = not tested)  GAIT: Gait pattern:  Short steps without heel strike and audible shuffling; slow; trunk flexed   Assistive device utilized: Environmental consultant - 2 wheeled   FUNCTIONAL TESTs:      PATIENT EDUCATION: Education details: prognosis, POC, HEP; edu on importance of increasing balance confidence on function  Person  educated: Patient Education method: Explanation, Demonstration, Tactile cues, Verbal cues, and Handouts Education comprehension: verbalized understanding and returned demonstration   HOME EXERCISE PROGRAM: Access Code: [email or web address] URL: [email or web address] Date: 11/18/2021 Prepared by: [facility]  Exercises - Mini Squat with Counter Support  - 1 x daily - 5 x weekly - 2 sets - 10 reps - Wide Stance with Counter Support  - 1 x daily - 5 x weekly - 2-3 sets - 10 reps - 30 sec hold - Narrow Stance with Counter Support  - 1 x daily - 5 x weekly - 2-3 sets - 30 sec hold - Standing Toe Taps  - 1 x daily - 5 x weekly - 2 sets - 10 reps - Seated Hamstring Curl with Anchored Resistance  - 1 x daily - 5 x weekly - 2 sets - 10 reps    GOALS: Goals reviewed with patient? Yes  SHORT TERM GOALS: Target date: 12/09/2021  Patient to be independent with initial HEP. Baseline: HEP initiated Goal status: INITIAL    LONG TERM GOALS: Target date: 12/30/2021  Patient to be independent with advanced HEP. Baseline: Not yet initiated  Goal status: IN PROGRESS  Patient to demonstrate R LE strength >/=4+/5.  Baseline: See above Goal status: IN PROGRESS  Patient to report 70% improvement in balance confidence.  Baseline: reports poor balance confidence  Goal status: IN PROGRESS  Patient to complete TUG in <14 sec with LRAD in order to decrease risk of falls.   Baseline: 24.35 sec with RW Goal status: IN PROGRESS  Patient to demonstrate 5xSTS test in <15 sec in order to decrease risk of falls.  Baseline: 17.1 sec  Goal status: IN PROGRESS  Patient to score at least 45/56 on Berg in order to decrease risk of falls.  Baseline: 39/56 Goal status: IN PROGRESS   ASSESSMENT:  CLINICAL IMPRESSION: Skilled PT session today focused on review of HEP from last visit; cues for widened BOS in posterior direction and for improved push off/hinge at hips  for stagger stance weightshifting.  Worked also on lateral weightshifting and lateral step strategy exercises, to address pt's reported fear of loss of balance, particularly to left side.  Pt tolerates exercises well, especially with cues for exercises, she demonstrates improved performance.  She does continue to benefit from cues to initiate and continue gait with heelstrike, push-off for more normalized gait pattern.  She will continue to benefit from skilled PT towards goals for improved functional mobility and decreased fall risk.  OBJECTIVE IMPAIRMENTS Abnormal gait, decreased activity tolerance, decreased balance, decreased strength, dizziness, and postural dysfunction.   ACTIVITY LIMITATIONS carrying, lifting, bending, standing, squatting, transfers, bathing, toileting, dressing, reach over head, hygiene/grooming, and caring for others  PARTICIPATION LIMITATIONS: meal prep, cleaning, laundry, shopping, community activity, and church  PERSONAL FACTORS Age, Behavior pattern, Fitness, Past/current experiences, Time since onset of injury/illness/exacerbation, and 3+ comorbidities: asthma, hypothyroidism, parkinsonism   are also affecting patient's functional outcome.   REHAB POTENTIAL: Good  CLINICAL DECISION MAKING:  Evolving/moderate complexity  EVALUATION COMPLEXITY: Moderate  PLAN: PT FREQUENCY: 2x/week  PT DURATION: 6 weeks  PLANNED INTERVENTIONS: Therapeutic exercises, Therapeutic activity, Neuromuscular re-education, Balance training, Gait training, Patient/Family education, Self Care, Joint mobilization, Stair training, Vestibular training, Canalith repositioning, DME instructions, Aquatic Therapy, Dry Needling, Cryotherapy, Moist heat, Taping, Manual therapy, and Re-evaluation  PLAN FOR NEXT SESSION: Continue gait training with quad tip cane; progress balance activities while weaning UE support to improve balance confidence -including hip/ankle/step strategies   Mady Haagensen,  PT 11/29/21 11:57 AM Phone: [phone] Fax: [place] [facility] [address] [place], [place] [address] Phone # [phone] Fax # [phone]

## 2021-12-01 ENCOUNTER — Encounter: Payer: Self-pay | Admitting: Physical Therapy

## 2021-12-01 ENCOUNTER — Ambulatory Visit: Payer: Medicare Other | Admitting: Physical Therapy

## 2021-12-01 DIAGNOSIS — R2681 Unsteadiness on feet: Secondary | ICD-10-CM

## 2021-12-01 DIAGNOSIS — R42 Dizziness and giddiness: Secondary | ICD-10-CM

## 2021-12-01 DIAGNOSIS — R2689 Other abnormalities of gait and mobility: Secondary | ICD-10-CM

## 2021-12-02 ENCOUNTER — Encounter (HOSPITAL_COMMUNITY)
Admission: RE | Admit: 2021-12-02 | Discharge: 2021-12-02 | Disposition: A | Discharge status: Home / Self Care | Payer: Medicare Other | Source: Ambulatory Visit | Attending: Neurology | Admitting: Neurology

## 2021-12-02 DIAGNOSIS — G20 Parkinson's disease: Secondary | ICD-10-CM | POA: Insufficient documentation

## 2021-12-02 DIAGNOSIS — R269 Unspecified abnormalities of gait and mobility: Secondary | ICD-10-CM | POA: Insufficient documentation

## 2021-12-02 MED ORDER — TECHNETIUM TO 99M ALBUMIN AGGREGATED
4.7000 | Freq: Once | INTRAVENOUS | Status: AC | PRN
  Administered 2021-12-02: 4.7 via INTRAVENOUS

## 2021-12-02 MED ORDER — IOFLUPANE I 123 185 MBQ/2.5ML IV SOLN
4.7000 | Freq: Once | INTRAVENOUS | Status: AC | PRN
  Administered 2021-12-02: 4.7 via INTRAVENOUS

## 2021-12-02 MED ORDER — POTASSIUM IODIDE (ANTIDOTE) 130 MG PO TABS
ORAL_TABLET | ORAL | Status: AC
  Administered 2021-12-02: 130 mg via ORAL
  Filled 2021-12-02: qty 1

## 2021-12-02 MED ORDER — POTASSIUM IODIDE (ANTIDOTE) 130 MG PO TABS: 130.0000 mg | ORAL_TABLET | Freq: Once | ORAL | Status: DC

## 2021-12-02 NOTE — Therapy (Signed)
OUTPATIENT PHYSICAL THERAPY NEURO TREATMENT   Patient Name: Emily Kelley MRN: [id number] DOB:Apr 27, 1950, 71 y.o., female Today's Date: 12/05/2021   PCP: London Pepper, MD REFERRING PROVIDER: Alric Ran, MD    PT End of Session - 12/05/21 1057     Visit Number 6    Number of Visits 13    Date for PT Re-Evaluation 12/30/21    Authorization Type Medicare    PT Start Time 1018    PT Stop Time 1056    PT Time Calculation (min) 38 min    Equipment Utilized During Treatment Gait belt    Activity Tolerance Patient tolerated treatment well    Behavior During Therapy WFL for tasks assessed/performed                  Past Medical History:  Diagnosis Date   Allergic rhinitis, seasonal    Asthma    CHILDHOOD   HTN (hypertension)    Hypercalcemia    Hypothyroidism    Osteopenia    Sleep apnea    Past Surgical History:  Procedure Laterality Date   CATARACT EXTRACTION     HEMORRHOID SURGERY     x2 polyp   NASAL SEPTUM SURGERY     PARATHYROID EXPLORATION     RETINAL DETACHMENT SURGERY     TONSILLECTOMY AND ADENOIDECTOMY     Patient Active Problem List   Diagnosis Date Noted   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   Hypothyroidism 12/29/2009   Seasonal and perennial allergic rhinitis 01/06/2008   Obstructive sleep apnea 01/06/2008    ONSET DATE: 1 year and 3 months   REFERRING DIAG: R26.9 (ICD-10-CM) - Gait abnormality  THERAPY DIAG:  Unsteadiness on feet  Other abnormalities of gait and mobility  Dizziness and giddiness  Rationale for Evaluation and Treatment Rehabilitation  SUBJECTIVE:                                                                                                                                                                                              SUBJECTIVE STATEMENT: Continues to perform her exercises. Toe tap in the hardest one. Reports that she had a DatScan and MRI which were clear.   Pt accompanied by: self  PERTINENT  HISTORY: asthma, hypothyroidism, parkinsonism   PAIN:  Are you having pain? No  PRECAUTIONS: Fall  PATIENT GOALS improve balance and dizziness   OBJECTIVE:     TODAY'S TREATMENT: 12/02/21 Activity Comments  gait training with quad tip cane outside on sidewalk x255ft  Cues for heel-toe pattern on R foot; good stability  Heel/toe raise 20x Cues to avoid hip movement  walking on heels/toes in II bars 5x each Cues to increase step length; difficulty maintaining toe raise B  backwards walking Without UE support; good stability  walking EC in II bars  Without UE support  Rockerboard ant/pos and R/L directions static balance and wt shifts More trouble in R/L direction; cues to tighten core   tandem stance 2x30" each Cues for core contraction and more rigid upper body; more challenge with L foot in front       HOME EXERCISE PROGRAM Last updated: 12/05/21 Access Code: [email or web address] URL: [email or web address] Date: 12/05/2021 Prepared by: [facility]  Exercises - Standing Toe Taps  - 1 x daily - 5 x weekly - 2 sets - 10 reps - Seated Hamstring Curl with Anchored Resistance  - 1 x daily - 5 x weekly - 2 sets - 10 reps - Backward Walking with Counter Support  - 1 x daily - 5 x weekly - 2 sets - 10 reps - Staggered Stance Forward Backward Weight Shift with Counter Support  - 1 x daily - 5 x weekly - 2 sets - 10 reps - Forward Backward Weight Shift with Counter Support  - 1 x daily - 5 x weekly - 2 sets - 10 reps - Lateral Weight Shift with Arm Raise  - 1 x daily - 5 x weekly - 2 sets - 10 reps - Romberg Stance on Foam Pad  - 1 x daily - 5 x weekly - 2-3 sets - 30 sec hold - Toe Walking with Counter Support  - 1 x daily - 5 x weekly - 2 sets - 5 reps - Heel Walking with Counter Support  - 1 x daily - 5 x weekly - 2 sets - 5 reps - Walking with Eyes Closed and Counter Support  - 1 x daily - 5 x weekly - 2 sets - 5 reps    PATIENT  EDUCATION: Education details: HEP update; encouraged practicing ambulation with cane in flat parking lot Person educated: Patient Education method: Explanation, Demonstration, Tactile cues, Verbal cues, and Handouts Education comprehension: verbalized understanding and returned demonstration    Below measures were taken at time of initial evaluation unless otherwise specified:  DIAGNOSTIC FINDINGS: pt reports her recent MRI was clear   COGNITION: Overall cognitive status: Within functional limits for tasks assessed   SENSATION: WFL  COORDINATION: Intact alt pronation/supination and finger to nose    POSTURE: forward head, rounded shoulder and thoracic spine; forward trunk flexion   LOWER EXTREMITY ROM:     Active  Right Eval Left Eval  Hip flexion    Hip extension    Hip abduction    Hip adduction    Hip internal rotation    Hip external rotation    Knee flexion    Knee extension    Ankle dorsiflexion 15 7  Ankle plantarflexion    Ankle inversion    Ankle eversion     (Blank rows = not tested)  LOWER EXTREMITY MMT:    MMT (in sitting) Right Eval Left Eval  Hip flexion 4+ 4+  Hip extension    Hip abduction 4 4+  Hip adduction 4+ 4+  Hip internal rotation    Hip external rotation    Knee flexion 4- 4+  Knee extension 4- 4+  Ankle dorsiflexion 4 4+  Ankle plantarflexion 4+ 4+  Ankle inversion    Ankle eversion    (Blank rows = not tested)  GAIT:  Gait pattern:  Short steps without heel strike and audible shuffling; slow; trunk flexed   Assistive device utilized: Environmental consultant - 2 wheeled   FUNCTIONAL TESTs:      PATIENT EDUCATION: Education details: prognosis, POC, HEP; edu on importance of increasing balance confidence on function  Person educated: Patient Education method: Explanation, Demonstration, Tactile cues, Verbal cues, and Handouts Education comprehension: verbalized understanding and returned demonstration   HOME EXERCISE PROGRAM: Access  Code: [email or web address] URL: [email or web address] Date: 11/18/2021 Prepared by: [facility]  Exercises - Mini Squat with Counter Support  - 1 x daily - 5 x weekly - 2 sets - 10 reps - Wide Stance with Counter Support  - 1 x daily - 5 x weekly - 2-3 sets - 10 reps - 30 sec hold - Narrow Stance with Counter Support  - 1 x daily - 5 x weekly - 2-3 sets - 30 sec hold - Standing Toe Taps  - 1 x daily - 5 x weekly - 2 sets - 10 reps - Seated Hamstring Curl with Anchored Resistance  - 1 x daily - 5 x weekly - 2 sets - 10 reps    GOALS: Goals reviewed with patient? Yes  SHORT TERM GOALS: Target date: 12/09/2021  Patient to be independent with initial HEP. Baseline: HEP initiated Goal status: MET    LONG TERM GOALS: Target date: 12/30/2021  Patient to be independent with advanced HEP. Baseline: Not yet initiated  Goal status: IN PROGRESS  Patient to demonstrate R LE strength >/=4+/5.  Baseline: See above Goal status: IN PROGRESS  Patient to report 70% improvement in balance confidence.  Baseline: reports poor balance confidence  Goal status: IN PROGRESS  Patient to complete TUG in <14 sec with LRAD in order to decrease risk of falls.   Baseline: 24.35 sec with RW Goal status: IN PROGRESS  Patient to demonstrate 5xSTS test in <15 sec in order to decrease risk of falls.  Baseline: 17.1 sec  Goal status: IN PROGRESS  Patient to score at least 45/56 on Berg in order to decrease risk of falls.  Baseline: 39/56 Goal status: IN PROGRESS   ASSESSMENT:  CLINICAL IMPRESSION: Patient arrived to session with report of a clear DatScan recently. Worked on gait training with cane outside with cueing for heel-toe pattern, which appeared difficult on the R foot. Static and dynamic balance activities were performed in II bars but only required occasional UE support. Patient was able to perform balance activities on compliant surface with increased  challenge with R/L weight shifts compared to ant/pos direction. Reported understanding of HEP update and without complaints upon leaving.   OBJECTIVE IMPAIRMENTS Abnormal gait, decreased activity tolerance, decreased balance, decreased strength, dizziness, and postural dysfunction.   ACTIVITY LIMITATIONS carrying, lifting, bending, standing, squatting, transfers, bathing, toileting, dressing, reach over head, hygiene/grooming, and caring for others  PARTICIPATION LIMITATIONS: meal prep, cleaning, laundry, shopping, community activity, and church  PERSONAL FACTORS Age, Behavior pattern, Fitness, Past/current experiences, Time since onset of injury/illness/exacerbation, and 3+ comorbidities: asthma, hypothyroidism, parkinsonism   are also affecting patient's functional outcome.   REHAB POTENTIAL: Good  CLINICAL DECISION MAKING: Evolving/moderate complexity  EVALUATION COMPLEXITY: Moderate  PLAN: PT FREQUENCY: 2x/week  PT DURATION: 6 weeks  PLANNED INTERVENTIONS: Therapeutic exercises, Therapeutic activity, Neuromuscular re-education, Balance training, Gait training, Patient/Family education, Self Care, Joint mobilization, Stair training, Vestibular training, Canalith repositioning, DME instructions, Aquatic Therapy, Dry Needling, Cryotherapy, Moist heat, Taping, Manual therapy, and Re-evaluation  PLAN  FOR NEXT SESSION: Continue gait training outside with quad tip cane; progress balance activities while weaning UE support to improve balance confidence -including hip/ankle/step strategies   Janene Harvey, PT, DPT 12/05/21 10:58 AM  [facility] at [facility] [address] [place], [place] [address] Phone # [phone] Fax # [phone]

## 2021-12-05 ENCOUNTER — Ambulatory Visit: Payer: Medicare Other | Admitting: Physical Therapy

## 2021-12-05 ENCOUNTER — Encounter: Payer: Self-pay | Admitting: Physical Therapy

## 2021-12-05 DIAGNOSIS — R42 Dizziness and giddiness: Secondary | ICD-10-CM

## 2021-12-05 DIAGNOSIS — R2681 Unsteadiness on feet: Secondary | ICD-10-CM | POA: Diagnosis not present

## 2021-12-05 DIAGNOSIS — R2689 Other abnormalities of gait and mobility: Secondary | ICD-10-CM

## 2021-12-08 ENCOUNTER — Encounter: Payer: Self-pay | Admitting: Physical Therapy

## 2021-12-08 ENCOUNTER — Ambulatory Visit: Payer: Medicare Other | Admitting: Physical Therapy

## 2021-12-08 DIAGNOSIS — R2681 Unsteadiness on feet: Secondary | ICD-10-CM | POA: Diagnosis not present

## 2021-12-08 DIAGNOSIS — R42 Dizziness and giddiness: Secondary | ICD-10-CM

## 2021-12-08 DIAGNOSIS — R2689 Other abnormalities of gait and mobility: Secondary | ICD-10-CM

## 2021-12-08 NOTE — Therapy (Signed)
OUTPATIENT PHYSICAL THERAPY NEURO TREATMENT   Patient Name: Emily Kelley MRN: [id number] DOB:1950/04/23, 71 y.o., female Today's Date: 12/08/2021   PCP: London Pepper, MD REFERRING PROVIDER: Alric Ran, MD    PT End of Session - 12/08/21 1058     Visit Number 7    Number of Visits 13    Date for PT Re-Evaluation 12/30/21    Authorization Type Medicare    PT Start Time 1012    PT Stop Time 1057    PT Time Calculation (min) 45 min    Equipment Utilized During Treatment Gait belt    Activity Tolerance Patient tolerated treatment well    Behavior During Therapy WFL for tasks assessed/performed                   Past Medical History:  Diagnosis Date   Allergic rhinitis, seasonal    Asthma    CHILDHOOD   HTN (hypertension)    Hypercalcemia    Hypothyroidism    Osteopenia    Sleep apnea    Past Surgical History:  Procedure Laterality Date   CATARACT EXTRACTION     HEMORRHOID SURGERY     x2 polyp   NASAL SEPTUM SURGERY     PARATHYROID EXPLORATION     RETINAL DETACHMENT SURGERY     TONSILLECTOMY AND ADENOIDECTOMY     Patient Active Problem List   Diagnosis Date Noted   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   Hypothyroidism 12/29/2009   Seasonal and perennial allergic rhinitis 01/06/2008   Obstructive sleep apnea 01/06/2008    ONSET DATE: 1 year and 3 months   REFERRING DIAG: R26.9 (ICD-10-CM) - Gait abnormality  THERAPY DIAG:  Unsteadiness on feet  Other abnormalities of gait and mobility  Dizziness and giddiness  Rationale for Evaluation and Treatment Rehabilitation  SUBJECTIVE:                                                                                                                                                                                              SUBJECTIVE STATEMENT: Got some practice walking in the breezeway with the cane this AM. Still having some imbalance with turns.   Pt accompanied by: self  PERTINENT HISTORY:  asthma, hypothyroidism, parkinsonism   PAIN:  Are you having pain? No  PRECAUTIONS: Fall  PATIENT GOALS improve balance and dizziness   OBJECTIVE:      TODAY'S TREATMENT: 12/08/21 Activity Comments  gait training with quad tip cane outside on grass and sidewalk x 792 ft  Good sequencing and controlled descent down declines; supervision throughout then CGA on grass for improved security; no LOB  alt posterior step on foam 20x, alternating side step up/down foam 20x Good stability; cueing for proper foot position  tandem stance fwd/back 3x length of II bars Cueing to touch heel to toe each time; R lean/LOB requiring 1 UE support  step up 15x each foot No UE support; cueing for safe foot placement and to look down upon step up  Gait training without AD with CGA x260f Cueing to "pretend like you're walking with your cane" to improve confidence      PATIENT EDUCATION: Education details: discussed opportunities to start using patient's quad tip cane in safe environments such as grocery store, parking lot, etc. Encouraged her to walk with cane into next appointments and practice walking without AD indoors  Person educated: Patient Education method: Explanation, Demonstration, Tactile cues, Verbal cues, and Handouts Education comprehension: verbalized understanding and returned demonstration    HOME EXERCISE PROGRAM Last updated: 12/08/21 Access Code: [email or web address]: [email or web address] Date: 12/08/2021 [facility]  Exercises - Standing Toe Taps  - 1 x daily - 5 x weekly - 2 sets - 10 reps - Seated Hamstring Curl with Anchored Resistance  - 1 x daily - 5 x weekly - 2 sets - 10 reps - Staggered Stance Forward Backward Weight Shift with Counter Support  - 1 x daily - 5 x weekly - 2 sets - 10 reps - Forward Backward Weight Shift with Counter Support  - 1 x daily - 5 x weekly - 2 sets - 10 reps - Lateral Weight Shift  with Arm Raise  - 1 x daily - 5 x weekly - 2 sets - 10 reps - Romberg Stance on Foam Pad  - 1 x daily - 5 x weekly - 2-3 sets - 30 sec hold - Toe Walking with Counter Support  - 1 x daily - 5 x weekly - 2 sets - 5 reps - Heel Walking with Counter Support  - 1 x daily - 5 x weekly - 2 sets - 5 reps - Tandem Walking with Counter Support  - 1 x daily - 5 x weekly - 5 sets   Below measures were taken at time of initial evaluation unless otherwise specified:  DIAGNOSTIC FINDINGS: pt reports her recent MRI was clear   COGNITION: Overall cognitive status: Within functional limits for tasks assessed   SENSATION: WFL  COORDINATION: Intact alt pronation/supination and finger to nose    POSTURE: forward head, rounded shoulder and thoracic spine; forward trunk flexion   LOWER EXTREMITY ROM:     Active  Right Eval Left Eval  Hip flexion    Hip extension    Hip abduction    Hip adduction    Hip internal rotation    Hip external rotation    Knee flexion    Knee extension    Ankle dorsiflexion 15 7  Ankle plantarflexion    Ankle inversion    Ankle eversion     (Blank rows = not tested)  LOWER EXTREMITY MMT:    MMT (in sitting) Right Eval Left Eval  Hip flexion 4+ 4+  Hip extension    Hip abduction 4 4+  Hip adduction 4+ 4+  Hip internal rotation    Hip external rotation    Knee flexion 4- 4+  Knee extension 4- 4+  Ankle dorsiflexion 4 4+  Ankle plantarflexion 4+ 4+  Ankle inversion    Ankle eversion    (Blank rows = not tested)  GAIT: Gait pattern:  Short steps without heel strike and audible shuffling; slow; trunk flexed   Assistive device utilized: Environmental consultant - 2 wheeled   FUNCTIONAL TESTs:      PATIENT EDUCATION: Education details: prognosis, POC, HEP; edu on importance of increasing balance confidence on function  Person educated: Patient Education method: Explanation, Demonstration, Tactile cues, Verbal cues, and Handouts Education comprehension: verbalized  understanding and returned demonstration   HOME EXERCISE PROGRAM: Access Code: [email or web address] URL: [email or web address] Date: 11/18/2021 Prepared by: [facility]  Exercises - Mini Squat with Counter Support  - 1 x daily - 5 x weekly - 2 sets - 10 reps - Wide Stance with Counter Support  - 1 x daily - 5 x weekly - 2-3 sets - 10 reps - 30 sec hold - Narrow Stance with Counter Support  - 1 x daily - 5 x weekly - 2-3 sets - 30 sec hold - Standing Toe Taps  - 1 x daily - 5 x weekly - 2 sets - 10 reps - Seated Hamstring Curl with Anchored Resistance  - 1 x daily - 5 x weekly - 2 sets - 10 reps    GOALS: Goals reviewed with patient? Yes  SHORT TERM GOALS: Target date: 12/09/2021  Patient to be independent with initial HEP. Baseline: HEP initiated Goal status: MET    LONG TERM GOALS: Target date: 12/30/2021  Patient to be independent with advanced HEP. Baseline: Not yet initiated  Goal status: IN PROGRESS  Patient to demonstrate R LE strength >/=4+/5.  Baseline: See above Goal status: IN PROGRESS  Patient to report 70% improvement in balance confidence.  Baseline: reports poor balance confidence  Goal status: IN PROGRESS  Patient to complete TUG in <14 sec with LRAD in order to decrease risk of falls.   Baseline: 24.35 sec with RW Goal status: IN PROGRESS  Patient to demonstrate 5xSTS test in <15 sec in order to decrease risk of falls.  Baseline: 17.1 sec  Goal status: IN PROGRESS  Patient to score at least 45/56 on Berg in order to decrease risk of falls.  Baseline: 39/56 Goal status: IN PROGRESS   ASSESSMENT:  CLINICAL IMPRESSION: Patient arrived to session with report of practicing outside with her cane. Worked on gait training outside on uneven surfaces today with quad tip cane with good stability throughout and without LOB. Patient's confidence continues to improve with practice. Able to perform dynamic balance  activities on foam without UE support required. Did demonstrate from R lean with tandem walking, requiring light UE support. Ended session with gait training without AD with cues to improve confidence. Patient reported understanding of all edu provided and without complaints at end of session.   OBJECTIVE IMPAIRMENTS Abnormal gait, decreased activity tolerance, decreased balance, decreased strength, dizziness, and postural dysfunction.   ACTIVITY LIMITATIONS carrying, lifting, bending, standing, squatting, transfers, bathing, toileting, dressing, reach over head, hygiene/grooming, and caring for others  PARTICIPATION LIMITATIONS: meal prep, cleaning, laundry, shopping, community activity, and church  PERSONAL FACTORS Age, Behavior pattern, Fitness, Past/current experiences, Time since onset of injury/illness/exacerbation, and 3+ comorbidities: asthma, hypothyroidism, parkinsonism   are also affecting patient's functional outcome.   REHAB POTENTIAL: Good  CLINICAL DECISION MAKING: Evolving/moderate complexity  EVALUATION COMPLEXITY: Moderate  PLAN: PT FREQUENCY: 2x/week  PT DURATION: 6 weeks  PLANNED INTERVENTIONS: Therapeutic exercises, Therapeutic activity, Neuromuscular re-education, Balance training, Gait training, Patient/Family education, Self Care, Joint mobilization, Stair training, Vestibular training, Canalith repositioning, DME instructions, Aquatic  Therapy, Dry Needling, Cryotherapy, Moist heat, Taping, Manual therapy, and Re-evaluation  PLAN FOR NEXT SESSION: Continue gait training without cane; progress balance activities while weaning UE support to improve balance confidence -including hip/ankle/step strategies   Janene Harvey, PT, DPT 12/08/21 10:58 AM  [facility] at [facility] [address] [place], [place] [address] Phone # [phone] Fax # [phone]

## 2021-12-13 ENCOUNTER — Ambulatory Visit: Payer: Medicare Other | Attending: Neurology

## 2021-12-13 DIAGNOSIS — R42 Dizziness and giddiness: Secondary | ICD-10-CM | POA: Insufficient documentation

## 2021-12-13 DIAGNOSIS — R2681 Unsteadiness on feet: Secondary | ICD-10-CM | POA: Insufficient documentation

## 2021-12-13 DIAGNOSIS — R2689 Other abnormalities of gait and mobility: Secondary | ICD-10-CM | POA: Diagnosis present

## 2021-12-13 NOTE — Therapy (Signed)
OUTPATIENT PHYSICAL THERAPY NEURO TREATMENT   Patient Name: Emily Kelley MRN: [id number] DOB:June 27, 1950, 71 y.o., female Today's Date: 12/13/2021   PCP: London Pepper, MD REFERRING PROVIDER: Alric Ran, MD    PT End of Session - 12/13/21 1102     Visit Number 8    Number of Visits 13    Date for PT Re-Evaluation 12/30/21    Authorization Type Medicare    PT Start Time 1100    PT Stop Time 1145    PT Time Calculation (min) 45 min    Equipment Utilized During Treatment Gait belt    Activity Tolerance Patient tolerated treatment well    Behavior During Therapy WFL for tasks assessed/performed                   Past Medical History:  Diagnosis Date   Allergic rhinitis, seasonal    Asthma    CHILDHOOD   HTN (hypertension)    Hypercalcemia    Hypothyroidism    Osteopenia    Sleep apnea    Past Surgical History:  Procedure Laterality Date   CATARACT EXTRACTION     HEMORRHOID SURGERY     x2 polyp   NASAL SEPTUM SURGERY     PARATHYROID EXPLORATION     RETINAL DETACHMENT SURGERY     TONSILLECTOMY AND ADENOIDECTOMY     Patient Active Problem List   Diagnosis Date Noted   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   Hypothyroidism 12/29/2009   Seasonal and perennial allergic rhinitis 01/06/2008   Obstructive sleep apnea 01/06/2008    ONSET DATE: 1 year and 3 months   REFERRING DIAG: R26.9 (ICD-10-CM) - Gait abnormality  THERAPY DIAG:  Unsteadiness on feet  Other abnormalities of gait and mobility  Dizziness and giddiness  Rationale for Evaluation and Treatment Rehabilitation  SUBJECTIVE:                                                                                                                                                                                              SUBJECTIVE STATEMENT: Feeling more confident walking with cane no issues with dizziness or other related symptoms..   Pt accompanied by: self  PERTINENT HISTORY: asthma,  hypothyroidism, parkinsonism   PAIN:  Are you having pain? No  PRECAUTIONS: Fall  PATIENT GOALS improve balance and dizziness   OBJECTIVE:    TODAY'S TREATMENT: 12/13/21 Activity Comments  Gait training  -forward/backward in // bars x 2 min -no AD for 4x15 ft -Large amplitude arm swing x 85 ft CGA-SBA -Large stride x 85 ft -farmer's carry 10# left/right 85 ft -front rack carry/march along countertop  Alt stair taps x 2 min 12" box BUE to no UE  Weight tower: X 2 min 15 lbs backward/forward X 60 sidestepping 10 lbs  Goblet squat 2x5 10 lbs            TODAY'S TREATMENT: 12/08/21 Activity Comments  gait training with quad tip cane outside on grass and sidewalk x 792 ft  Good sequencing and controlled descent down declines; supervision throughout then CGA on grass for improved security; no LOB  alt posterior step on foam 20x, alternating side step up/down foam 20x Good stability; cueing for proper foot position  tandem stance fwd/back 3x length of II bars Cueing to touch heel to toe each time; R lean/LOB requiring 1 UE support  step up 15x each foot No UE support; cueing for safe foot placement and to look down upon step up  Gait training without AD with CGA x273f Cueing to "pretend like you're walking with your cane" to improve confidence      PATIENT EDUCATION: Education details: discussed opportunities to start using patient's quad tip cane in safe environments such as grocery store, parking lot, etc. Encouraged her to walk with cane into next appointments and practice walking without AD indoors  Person educated: Patient Education method: Explanation, Demonstration, Tactile cues, Verbal cues, and Handouts Education comprehension: verbalized understanding and returned demonstration    HOME EXERCISE PROGRAM Last updated: 12/08/21 Access Code: [email or web address]: [email or web address] Date: 12/08/2021 [facility] by: [facility]  Exercises - Standing Toe Taps  - 1 x daily - 5 x weekly - 2 sets - 10 reps - Seated Hamstring Curl with Anchored Resistance  - 1 x daily - 5 x weekly - 2 sets - 10 reps - Staggered Stance Forward Backward Weight Shift with Counter Support  - 1 x daily - 5 x weekly - 2 sets - 10 reps - Forward Backward Weight Shift with Counter Support  - 1 x daily - 5 x weekly - 2 sets - 10 reps - Lateral Weight Shift with Arm Raise  - 1 x daily - 5 x weekly - 2 sets - 10 reps - Romberg Stance on Foam Pad  - 1 x daily - 5 x weekly - 2-3 sets - 30 sec hold - Toe Walking with Counter Support  - 1 x daily - 5 x weekly - 2 sets - 5 reps - Heel Walking with Counter Support  - 1 x daily - 5 x weekly - 2 sets - 5 reps - Tandem Walking with Counter Support  - 1 x daily - 5 x weekly - 5 sets   Below measures were taken at time of initial evaluation unless otherwise specified:  DIAGNOSTIC FINDINGS: pt reports her recent MRI was clear   COGNITION: Overall cognitive status: Within functional limits for tasks assessed   SENSATION: WFL  COORDINATION: Intact alt pronation/supination and finger to nose    POSTURE: forward head, rounded shoulder and thoracic spine; forward trunk flexion   LOWER EXTREMITY ROM:     Active  Right Eval Left Eval  Hip flexion    Hip extension    Hip abduction    Hip adduction    Hip internal rotation    Hip external rotation    Knee flexion    Knee extension    Ankle dorsiflexion 15 7  Ankle plantarflexion    Ankle inversion    Ankle eversion     (Blank rows = not tested)  LOWER EXTREMITY MMT:    MMT (in sitting) Right Eval Left Eval  Hip flexion 4+ 4+  Hip extension    Hip abduction 4 4+  Hip adduction 4+ 4+  Hip internal rotation    Hip external rotation    Knee flexion 4- 4+  Knee extension 4- 4+  Ankle dorsiflexion 4 4+  Ankle plantarflexion 4+ 4+  Ankle inversion    Ankle eversion    (Blank rows = not tested)  GAIT: Gait pattern:  Short  steps without heel strike and audible shuffling; slow; trunk flexed   Assistive device utilized: Environmental consultant - 2 wheeled   FUNCTIONAL TESTs:      PATIENT EDUCATION: Education details: prognosis, POC, HEP; edu on importance of increasing balance confidence on function  Person educated: Patient Education method: Explanation, Demonstration, Tactile cues, Verbal cues, and Handouts Education comprehension: verbalized understanding and returned demonstration   HOME EXERCISE PROGRAM: Access Code: [email or web address] URL: [email or web address] Date: 11/18/2021 Prepared by: [facility]  Exercises - Mini Squat with Counter Support  - 1 x daily - 5 x weekly - 2 sets - 10 reps - Wide Stance with Counter Support  - 1 x daily - 5 x weekly - 2-3 sets - 10 reps - 30 sec hold - Narrow Stance with Counter Support  - 1 x daily - 5 x weekly - 2-3 sets - 30 sec hold - Standing Toe Taps  - 1 x daily - 5 x weekly - 2 sets - 10 reps - Seated Hamstring Curl with Anchored Resistance  - 1 x daily - 5 x weekly - 2 sets - 10 reps -Gobelt squat with kettlebell 3-5 sets 5-10 reps -suitcase carry/march: by side and front rack position   GOALS: Goals reviewed with patient? Yes  SHORT TERM GOALS: Target date: 12/09/2021  Patient to be independent with initial HEP. Baseline: HEP initiated Goal status: MET    LONG TERM GOALS: Target date: 12/30/2021  Patient to be independent with advanced HEP. Baseline: Not yet initiated  Goal status: IN PROGRESS  Patient to demonstrate R LE strength >/=4+/5.  Baseline: See above Goal status: IN PROGRESS  Patient to report 70% improvement in balance confidence.  Baseline: reports poor balance confidence  Goal status: IN PROGRESS  Patient to complete TUG in <14 sec with LRAD in order to decrease risk of falls.   Baseline: 24.35 sec with RW Goal status: IN PROGRESS  Patient to demonstrate 5xSTS test in <15 sec in order to  decrease risk of falls.  Baseline: 17.1 sec  Goal status: IN PROGRESS  Patient to score at least 45/56 on Berg in order to decrease risk of falls.  Baseline: 39/56 Goal status: IN PROGRESS   ASSESSMENT:  CLINICAL IMPRESSION: Session focus on progressing gait training w/ emphasis on unilateral loading to promote postural stabilization and single limb support during gait cycle.  Tolerating short distance ambulation without AD albeit with wide BOS and shorter step length due to apprehension.  Pt reports she is performing some resistance training at home using kettlebells and trained in techniques to perform at home for unilateral loading as well as LE strength to complement her typical PRE. Continued sessions to advance dynamic balance and gait normalization.   OBJECTIVE IMPAIRMENTS Abnormal gait, decreased activity tolerance, decreased balance, decreased strength, dizziness, and postural dysfunction.   ACTIVITY LIMITATIONS carrying, lifting, bending, standing, squatting, transfers, bathing, toileting, dressing, reach over head, hygiene/grooming, and caring for others  PARTICIPATION  LIMITATIONS: meal prep, cleaning, laundry, shopping, community activity, and church  PERSONAL FACTORS Age, Behavior pattern, Fitness, Past/current experiences, Time since onset of injury/illness/exacerbation, and 3+ comorbidities: asthma, hypothyroidism, parkinsonism   are also affecting patient's functional outcome.   REHAB POTENTIAL: Good  CLINICAL DECISION MAKING: Evolving/moderate complexity  EVALUATION COMPLEXITY: Moderate  PLAN: PT FREQUENCY: 2x/week  PT DURATION: 6 weeks  PLANNED INTERVENTIONS: Therapeutic exercises, Therapeutic activity, Neuromuscular re-education, Balance training, Gait training, Patient/Family education, Self Care, Joint mobilization, Stair training, Vestibular training, Canalith repositioning, DME instructions, Aquatic Therapy, Dry Needling, Cryotherapy, Moist heat, Taping, Manual  therapy, and Re-evaluation  PLAN FOR NEXT SESSION: Continue gait training without cane; progress balance activities while weaning UE support to improve balance confidence -including hip/ankle/step strategies   12:03 PM, 12/13/21 M. Sherlyn Lees, PT, DPT Physical Therapist- [place] Office Number: [phone]   [place] at [facility] [address], [place], [place] [address] Phone # [phone] Fax # [phone]

## 2021-12-14 DIAGNOSIS — R2689 Other abnormalities of gait and mobility: Secondary | ICD-10-CM | POA: Insufficient documentation

## 2021-12-14 NOTE — Assessment & Plan Note (Signed)
Benefits from CPAP. Plan-continue auto 5-15.  [facility] to renew [employer].

## 2021-12-14 NOTE — Assessment & Plan Note (Signed)
Evaluated by neurology for possible movement disorder

## 2021-12-14 NOTE — Therapy (Signed)
OUTPATIENT PHYSICAL THERAPY NEURO TREATMENT   Patient Name: Emily Kelley MRN: [id number] DOB:07-01-1950, 71 y.o., female Today's Date: 12/15/2021   PCP: London Pepper, MD REFERRING PROVIDER: Alric Ran, MD    PT End of Session - 12/15/21 1143     Visit Number 9    Number of Visits 13    Date for PT Re-Evaluation 12/30/21    Authorization Type Medicare    PT Start Time 1103    PT Stop Time 1142    PT Time Calculation (min) 39 min    Equipment Utilized During Treatment Gait belt    Activity Tolerance Patient tolerated treatment well    Behavior During Therapy WFL for tasks assessed/performed                    Past Medical History:  Diagnosis Date   Allergic rhinitis, seasonal    Asthma    CHILDHOOD   HTN (hypertension)    Hypercalcemia    Hypothyroidism    Osteopenia    Sleep apnea    Past Surgical History:  Procedure Laterality Date   CATARACT EXTRACTION     HEMORRHOID SURGERY     x2 polyp   NASAL SEPTUM SURGERY     PARATHYROID EXPLORATION     RETINAL DETACHMENT SURGERY     TONSILLECTOMY AND ADENOIDECTOMY     Patient Active Problem List   Diagnosis Date Noted   Balance disorder 12/14/2021   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   Hypothyroidism 12/29/2009   Seasonal and perennial allergic rhinitis 01/06/2008   Obstructive sleep apnea 01/06/2008    ONSET DATE: 1 year and 3 months   REFERRING DIAG: R26.9 (ICD-10-CM) - Gait abnormality  THERAPY DIAG:  Unsteadiness on feet  Other abnormalities of gait and mobility  Dizziness and giddiness  Rationale for Evaluation and Treatment Rehabilitation  SUBJECTIVE:                                                                                                                                                                                              SUBJECTIVE STATEMENT: Ditched the walker.   Pt accompanied by: self  PERTINENT HISTORY: asthma, hypothyroidism, parkinsonism   PAIN:  Are you  having pain? No  PRECAUTIONS: Fall  PATIENT GOALS improve balance and dizziness   OBJECTIVE:     TODAY'S TREATMENT: 12/15/21 Activity Comments  Nustep L5 x 6 Ues/Les  Cues to maintain 60s SPM; to address pt's c/o stiffness   gait training with quad tip cane including figure 8 turns, stepping over obstacles, stepping on and off foam 1x with cane, 3x without; 1 episode of  LOB requiring min A; cueing to increase step length   gait training on sidewalk outside without AD [id number]  Cueing for reciprocal arm swing and increased step length; heavy heel strike and occasional mild LOB with self-correction  Romberg on foam EO/EC 30" Mild-mod sway   R/L fwd/back stepping on foam 10x each Cues to shift onto standing LE and increase step length; occasional LOB with CGA-min A      HOME EXERCISE PROGRAM Last updated: 12/15/21 Access Code: [email or web address]: [email or web address] Date: 12/15/2021 [facility]ared by: [facility]  Exercises - Standing Toe Taps  - 1 x daily - 5 x weekly - 2 sets - 10 reps - Lateral Weight Shift with Arm Raise  - 1 x daily - 5 x weekly - 2 sets - 10 reps - Romberg Stance on Foam Pad  - 1 x daily - 5 x weekly - 2 sets - 30 sec hold - Romberg Stance Eyes Closed on Foam Pad  - 1 x daily - 5 x weekly - 2 sets - 30 sec hold - Tandem Stance on Foam Pad with Eyes Open  - 1 x daily - 5 x weekly - 2 sets - 30 sec hold - Toe Walking with Counter Support  - 1 x daily - 5 x weekly - 2 sets - 5 reps - Heel Walking with Counter Support  - 1 x daily - 5 x weekly - 2 sets - 5 reps - Tandem Walking with Counter Support  - 1 x daily - 5 x weekly - 5 sets - Alternating Step Forward with Support  - 1 x daily - 5 x weekly - 2 sets - 10 reps   PATIENT EDUCATION: Education details: HEP update and consolidation  Person educated: Patient Education method: Explanation, Demonstration, Tactile cues, Verbal cues, and Handouts Education comprehension:  verbalized understanding and returned demonstration    Below measures were taken at time of initial evaluation unless otherwise specified:  DIAGNOSTIC FINDINGS: pt reports her recent MRI was clear   COGNITION: Overall cognitive status: Within functional limits for tasks assessed   SENSATION: WFL  COORDINATION: Intact alt pronation/supination and finger to nose    POSTURE: forward head, rounded shoulder and thoracic spine; forward trunk flexion   LOWER EXTREMITY ROM:     Active  Right Eval Left Eval  Hip flexion    Hip extension    Hip abduction    Hip adduction    Hip internal rotation    Hip external rotation    Knee flexion    Knee extension    Ankle dorsiflexion 15 7  Ankle plantarflexion    Ankle inversion    Ankle eversion     (Blank rows = not tested)  LOWER EXTREMITY MMT:    MMT (in sitting) Right Eval Left Eval  Hip flexion 4+ 4+  Hip extension    Hip abduction 4 4+  Hip adduction 4+ 4+  Hip internal rotation    Hip external rotation    Knee flexion 4- 4+  Knee extension 4- 4+  Ankle dorsiflexion 4 4+  Ankle plantarflexion 4+ 4+  Ankle inversion    Ankle eversion    (Blank rows = not tested)  GAIT: Gait pattern:  Short steps without heel strike and audible shuffling; slow; trunk flexed   Assistive device utilized: WEnvironmental consultant- 2 wheeled   FUNCTIONAL TESTs:      PATIENT EDUCATION: Education details: prognosis, POC, HEP; edu on importance  of increasing balance confidence on function  Person educated: Patient Education method: Explanation, Demonstration, Tactile cues, Verbal cues, and Handouts Education comprehension: verbalized understanding and returned demonstration   HOME EXERCISE PROGRAM: Access Code: [email or web address] URL: [email or web address] Date: 11/18/2021 Prepared by: [facility]  Exercises - Mini Squat with Counter Support  - 1 x daily - 5 x weekly - 2 sets - 10 reps - Wide  Stance with Counter Support  - 1 x daily - 5 x weekly - 2-3 sets - 10 reps - 30 sec hold - Narrow Stance with Counter Support  - 1 x daily - 5 x weekly - 2-3 sets - 30 sec hold - Standing Toe Taps  - 1 x daily - 5 x weekly - 2 sets - 10 reps - Seated Hamstring Curl with Anchored Resistance  - 1 x daily - 5 x weekly - 2 sets - 10 reps -Gobelt squat with kettlebell 3-5 sets 5-10 reps -suitcase carry/march: by side and front rack position   GOALS: Goals reviewed with patient? Yes  SHORT TERM GOALS: Target date: 12/09/2021  Patient to be independent with initial HEP. Baseline: HEP initiated Goal status: MET    LONG TERM GOALS: Target date: 12/30/2021  Patient to be independent with advanced HEP. Baseline: Not yet initiated  Goal status: IN PROGRESS  Patient to demonstrate R LE strength >/=4+/5.  Baseline: See above Goal status: IN PROGRESS  Patient to report 70% improvement in balance confidence.  Baseline: reports poor balance confidence  Goal status: IN PROGRESS  Patient to complete TUG in <14 sec with LRAD in order to decrease risk of falls.   Baseline: 24.35 sec with RW Goal status: IN PROGRESS  Patient to demonstrate 5xSTS test in <15 sec in order to decrease risk of falls.  Baseline: 17.1 sec  Goal status: IN PROGRESS  Patient to score at least 45/56 on Berg in order to decrease risk of falls.  Baseline: 39/56 Goal status: IN PROGRESS   ASSESSMENT:  CLINICAL IMPRESSION:  Patient arrived to session ambulating with cane with good stability but with remaining short step length. Continued gait training without AD today with additional balance challenges and ground surfaces. Patient demonstrated good overall stability with occasional minor LOB with ability to self-correct. Worked on consolidating HEP d/t patient's report of improving balance and confidence. Provided additional progression to balance exercises to work on at home. Patient reported understanding of all edu  provided and without complaints at end of session.    OBJECTIVE IMPAIRMENTS Abnormal gait, decreased activity tolerance, decreased balance, decreased strength, dizziness, and postural dysfunction.   ACTIVITY LIMITATIONS carrying, lifting, bending, standing, squatting, transfers, bathing, toileting, dressing, reach over head, hygiene/grooming, and caring for others  PARTICIPATION LIMITATIONS: meal prep, cleaning, laundry, shopping, community activity, and church  PERSONAL FACTORS Age, Behavior pattern, Fitness, Past/current experiences, Time since onset of injury/illness/exacerbation, and 3+ comorbidities: asthma, hypothyroidism, parkinsonism   are also affecting patient's functional outcome.   REHAB POTENTIAL: Good  CLINICAL DECISION MAKING: Evolving/moderate complexity  EVALUATION COMPLEXITY: Moderate  PLAN: PT FREQUENCY: 2x/week  PT DURATION: 6 weeks  PLANNED INTERVENTIONS: Therapeutic exercises, Therapeutic activity, Neuromuscular re-education, Balance training, Gait training, Patient/Family education, Self Care, Joint mobilization, Stair training, Vestibular training, Canalith repositioning, DME instructions, Aquatic Therapy, Dry Needling, Cryotherapy, Moist heat, Taping, Manual therapy, and Re-evaluation  PLAN FOR NEXT SESSION: Continue gait training without cane; progress balance activities while weaning UE support to improve balance confidence -including hip/ankle/step strategies  Janene Harvey, PT, DPT 12/15/21 11:45 AM  [facility] at [facility] [place], [place], [place] [address] Phone # [phone] Fax # [phone]

## 2021-12-15 ENCOUNTER — Ambulatory Visit: Payer: Medicare Other | Admitting: Physical Therapy

## 2021-12-15 ENCOUNTER — Encounter: Payer: Self-pay | Admitting: Physical Therapy

## 2021-12-15 DIAGNOSIS — R42 Dizziness and giddiness: Secondary | ICD-10-CM

## 2021-12-15 DIAGNOSIS — R2689 Other abnormalities of gait and mobility: Secondary | ICD-10-CM

## 2021-12-15 DIAGNOSIS — R2681 Unsteadiness on feet: Secondary | ICD-10-CM

## 2021-12-20 ENCOUNTER — Ambulatory Visit: Payer: Medicare Other | Admitting: Physical Therapy

## 2021-12-20 ENCOUNTER — Encounter: Payer: Self-pay | Admitting: Physical Therapy

## 2021-12-20 DIAGNOSIS — R42 Dizziness and giddiness: Secondary | ICD-10-CM

## 2021-12-20 DIAGNOSIS — R2681 Unsteadiness on feet: Secondary | ICD-10-CM

## 2021-12-20 DIAGNOSIS — R2689 Other abnormalities of gait and mobility: Secondary | ICD-10-CM

## 2021-12-20 NOTE — Therapy (Signed)
OUTPATIENT PHYSICAL THERAPY PROGRESS NOTE   Patient Name: Emily Kelley MRN: [id number] DOB:1950-05-10, 71 y.o., female Today's Date: 12/20/2021  Progress Note Reporting Period 11/18/21 to 12/20/21  See note below for Objective Data and Assessment of Progress/Goals.   PCP: London Pepper, MD REFERRING PROVIDER: Alric Ran, MD    PT End of Session - 12/20/21 1150     Visit Number 10    Number of Visits 14    Date for PT Re-Evaluation 01/17/22    Authorization Type Medicare    PT Start Time 1103    PT Stop Time 1147    PT Time Calculation (min) 44 min    Equipment Utilized During Treatment Gait belt    Activity Tolerance Patient tolerated treatment well    Behavior During Therapy WFL for tasks assessed/performed                Past Medical History:  Diagnosis Date   Allergic rhinitis, seasonal    Asthma    CHILDHOOD   HTN (hypertension)    Hypercalcemia    Hypothyroidism    Osteopenia    Sleep apnea    Past Surgical History:  Procedure Laterality Date   CATARACT EXTRACTION     HEMORRHOID SURGERY     x2 polyp   NASAL SEPTUM SURGERY     PARATHYROID EXPLORATION     RETINAL DETACHMENT SURGERY     TONSILLECTOMY AND ADENOIDECTOMY     Patient Active Problem List   Diagnosis Date Noted   Balance disorder 12/14/2021   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   Hypothyroidism 12/29/2009   Seasonal and perennial allergic rhinitis 01/06/2008   Obstructive sleep apnea 01/06/2008    ONSET DATE: 1 year and 3 months   REFERRING DIAG: R26.9 (ICD-10-CM) - Gait abnormality  THERAPY DIAG:  Unsteadiness on feet  Other abnormalities of gait and mobility  Dizziness and giddiness  Rationale for Evaluation and Treatment Rehabilitation  SUBJECTIVE:                                                                                                                                                                                              SUBJECTIVE STATEMENT: Has been  working on walking without the cane inside the house. Very pleased with her progress.   Pt accompanied by: self  PERTINENT HISTORY: asthma, hypothyroidism, parkinsonism   PAIN:  Are you having pain? No  PRECAUTIONS: Fall  PATIENT GOALS improve balance and dizziness   OBJECTIVE:     TODAY'S TREATMENT: 12/20/21 Activity Comments  Nustep L5 x 6 min Ues/Les  Maintaining atleast 60 SPM  LOWER EXTREMITY MMT:    MMT (in sitting) Right Eval Left Eval Right 12/20/21 Left 12/20/21  Hip flexion 4+ 4+ 4+ 4  Hip extension      Hip abduction 4 4+ 4+ 4+  Hip adduction 4+ 4+ 4+ 4+  Hip internal rotation      Hip external rotation      Knee flexion 4- 4+ 4+ 5  Knee extension 4- 4+ 4 4+  Ankle dorsiflexion 4 4+ 4 4+  Ankle plantarflexion 4+ 4+ 4+ 4+  Ankle inversion      Ankle eversion      (Blank rows = not tested)     [facility] PT Assessment - 12/20/21 0001       Standardized Balance Assessment   Five times sit to stand comments  12 sec   without UE support; 1 episode of slight LOB back into chair     Berg Balance Test   Sit to Stand Able to stand without using hands and stabilize independently    Standing Unsupported Able to stand safely 2 minutes    Sitting with Back Unsupported but Feet Supported on Floor or Stool Able to sit safely and securely 2 minutes    Stand to Sit Sits safely with minimal use of hands    Transfers Able to transfer safely, minor use of hands    Standing Unsupported with Eyes Closed Able to stand 10 seconds safely    Standing Unsupported with Feet Together Able to place feet together independently and stand 1 minute safely    From Standing, Reach Forward with Outstretched Arm Can reach confidently >25 cm (10")    From Standing Position, Pick up Object from Floor Able to pick up shoe safely and easily    From Standing Position, Turn to Look Behind Over each Shoulder Looks behind from both sides and weight shifts well    Turn 360 Degrees Able to  turn 360 degrees safely but slowly    Standing Unsupported, Alternately Place Feet on Step/Stool Able to stand independently and safely and complete 8 steps in 20 seconds    Standing Unsupported, One Foot in [employer] balance while stepping or standing   holds for 6 sec   Standing on One Leg Tries to lift leg/unable to hold 3 seconds but remains standing independently    Total Score 47      Timed Up and Go Test   Normal TUG (seconds) 14.85   with SPC, 17.28 sec without AD              HOME EXERCISE PROGRAM Last updated: 12/20/21 Access Code: [email or web address] URL: [email or web address] Date: 12/20/2021 Prepared by: [facility]  Exercises - Standing Toe Taps  - 1 x daily - 5 x weekly - 2 sets - 10 reps - Lateral Weight Shift with Arm Raise  - 1 x daily - 5 x weekly - 2 sets - 10 reps - Romberg Stance on Foam Pad  - 1 x daily - 5 x weekly - 2 sets - 30 sec hold - Romberg Stance Eyes Closed on Foam Pad  - 1 x daily - 5 x weekly - 2 sets - 30 sec hold - Tandem Stance on Foam Pad with Eyes Open  - 1 x daily - 5 x weekly - 2 sets - 30 sec hold - Toe Walking with Counter Support  - 1 x daily - 5 x weekly - 2 sets - 5 reps -  Heel Walking with Counter Support  - 1 x daily - 5 x weekly - 2 sets - 5 reps - Tandem Walking with Counter Support  - 1 x daily - 5 x weekly - 5 sets - Alternating Step Forward with Support  - 1 x daily - 5 x weekly - 2 sets - 10 reps - Forward Step Up  - 1 x daily - 5 x weekly - 2 sets - 10 reps - Marching with Resistance  - 1 x daily - 5 x weekly - 2 sets - 10 reps - Single Leg Stance with Support  - 1 x daily - 5 x weekly - 2 sets - 30 sec hold - Standing Tandem Balance with Counter Support  - 1 x daily - 5 x weekly - 2 sets - 30 sec hold   PATIENT EDUCATION: Education details: update to HEP; discussion on exam findings and goal status; encouraged [employer] membership for continued fitness  Person educated:  Patient Education method: Explanation, Demonstration, Tactile cues, Verbal cues, and Handouts Education comprehension: verbalized understanding and returned demonstration   Below measures were taken at time of initial evaluation unless otherwise specified:  DIAGNOSTIC FINDINGS: pt reports her recent MRI was clear   COGNITION: Overall cognitive status: Within functional limits for tasks assessed   SENSATION: WFL  COORDINATION: Intact alt pronation/supination and finger to nose    POSTURE: forward head, rounded shoulder and thoracic spine; forward trunk flexion   LOWER EXTREMITY ROM:     Active  Right Eval Left Eval  Hip flexion    Hip extension    Hip abduction    Hip adduction    Hip internal rotation    Hip external rotation    Knee flexion    Knee extension    Ankle dorsiflexion 15 7  Ankle plantarflexion    Ankle inversion    Ankle eversion     (Blank rows = not tested)  LOWER EXTREMITY MMT:    MMT (in sitting) Right Eval Left Eval  Hip flexion 4+ 4+  Hip extension    Hip abduction 4 4+  Hip adduction 4+ 4+  Hip internal rotation    Hip external rotation    Knee flexion 4- 4+  Knee extension 4- 4+  Ankle dorsiflexion 4 4+  Ankle plantarflexion 4+ 4+  Ankle inversion    Ankle eversion    (Blank rows = not tested)  GAIT: Gait pattern:  Short steps without heel strike and audible shuffling; slow; trunk flexed   Assistive device utilized: Environmental consultant - 2 wheeled   FUNCTIONAL TESTs:    [facility] PT Assessment - 12/20/21 0001       Standardized Balance Assessment   Five times sit to stand comments  12 sec   without UE support; 1 episode of slight LOB back into chair     Berg Balance Test   Sit to Stand Able to stand without using hands and stabilize independently    Standing Unsupported Able to stand safely 2 minutes    Sitting with Back Unsupported but Feet Supported on Floor or Stool Able to sit safely and securely 2 minutes    Stand to Sit Sits safely  with minimal use of hands    Transfers Able to transfer safely, minor use of hands    Standing Unsupported with Eyes Closed Able to stand 10 seconds safely    Standing Unsupported with Feet Together Able to place feet together independently and stand 1 minute safely  From Standing, Reach Forward with Outstretched Arm Can reach confidently >25 cm (10")    From Standing Position, Pick up Object from [place] to pick up shoe safely and easily    From Standing Position, Turn to Look Behind Over each Shoulder Looks behind from both sides and weight shifts well    Turn 360 Degrees Able to turn 360 degrees safely but slowly    Standing Unsupported, Alternately Place Feet on Step/Stool Able to stand independently and safely and complete 8 steps in 20 seconds    Standing Unsupported, One Foot in [employer] balance while stepping or standing   holds for 6 sec   Standing on One Leg Tries to lift leg/unable to hold 3 seconds but remains standing independently    Total Score 47      Timed Up and Go Test   Normal TUG (seconds) 14.85   with SPC, 17.28 sec without AD             PATIENT EDUCATION: Education details: prognosis, POC, HEP; edu on importance of increasing balance confidence on function  Person educated: Patient Education method: Explanation, Demonstration, Tactile cues, Verbal cues, and Handouts Education comprehension: verbalized understanding and returned demonstration   HOME EXERCISE PROGRAM: Access Code: [email or web address] URL: [email or web address] Date: 11/18/2021 Prepared by: [facility]  Exercises - Mini Squat with Counter Support  - 1 x daily - 5 x weekly - 2 sets - 10 reps - Wide Stance with Counter Support  - 1 x daily - 5 x weekly - 2-3 sets - 10 reps - 30 sec hold - Narrow Stance with Counter Support  - 1 x daily - 5 x weekly - 2-3 sets - 30 sec hold - Standing Toe Taps  - 1 x daily - 5 x weekly - 2 sets - 10 reps -  Seated Hamstring Curl with Anchored Resistance  - 1 x daily - 5 x weekly - 2 sets - 10 reps -Gobelt squat with kettlebell 3-5 sets 5-10 reps -suitcase carry/march: by side and front rack position   GOALS: Goals reviewed with patient? Yes  SHORT TERM GOALS: Target date: 12/09/2021  Patient to be independent with initial HEP. Baseline: HEP initiated Goal status: MET    LONG TERM GOALS: Target date: 01/17/2022  Patient to be independent with advanced HEP. Baseline: Not yet initiated  Goal status: IN PROGRESS  Patient to demonstrate R LE strength >/=4+/5.  Baseline: See above; 12/20/21 improving- see above Goal status: IN PROGRESS  Patient to report 70% improvement in balance confidence.  Baseline: reports poor balance confidence; 12/20/21 90% improvement Goal status: MET  Patient to complete TUG in <14 sec with LRAD in order to decrease risk of falls.   Baseline: 24.35 sec with RW; 14 & 17 sec 12/20/21 Goal status: IN PROGRESS  Patient to demonstrate 5xSTS test in <15 sec in order to decrease risk of falls.  Baseline: 17.1 sec; 12 sec 12/20/21 Goal status: MET  Patient to score at least 45/56 on Berg in order to decrease risk of falls.  Baseline: 39/56; 12/20/21 47/56 Goal status: MET  Patient to score at least 50/56 on Berg in order to decrease risk of falls.  Baseline: 39/56; 12/20/21 47/56 Goal status: IN PROGRESS   ASSESSMENT:  CLINICAL IMPRESSION: Patient arrived to session with report of good improvement in perceived stability since starting with PT. Reports compliance with and understanding of HEP and notes 90% improvement in balance confidence.  Strength testing revealed improvement in R hip abduction, B knee flexion, R knee extension. Mild remaining weakness evident in L hip flexion, R knee extension, and R dorsiflexion. Able to complete TUG testing today with LRAD and without AD, showing good improvement in gait speed with both. Patient's score on 5xSTS now  indicates a decreased risk of falls. Patient scored 47/56 on Berg, indicating a decreased risk of falls. Remaining imbalance evident with SLS and tandem balance. Updated HEP to address remaining impairments. Patient reported understanding and without complaints at end fo session. Patient is progressing well and would benefit from additional skilled PT services 1x/week for 4 weeks to address remaining goals.    OBJECTIVE IMPAIRMENTS Abnormal gait, decreased activity tolerance, decreased balance, decreased strength, dizziness, and postural dysfunction.   ACTIVITY LIMITATIONS carrying, lifting, bending, standing, squatting, transfers, bathing, toileting, dressing, reach over head, hygiene/grooming, and caring for others  PARTICIPATION LIMITATIONS: meal prep, cleaning, laundry, shopping, community activity, and church  PERSONAL FACTORS Age, Behavior pattern, Fitness, Past/current experiences, Time since onset of injury/illness/exacerbation, and 3+ comorbidities: asthma, hypothyroidism, parkinsonism   are also affecting patient's functional outcome.   REHAB POTENTIAL: Good  CLINICAL DECISION MAKING: Evolving/moderate complexity  EVALUATION COMPLEXITY: Moderate  PLAN: PT FREQUENCY: 1x/week  PT DURATION: 4 weeks  PLANNED INTERVENTIONS: Therapeutic exercises, Therapeutic activity, Neuromuscular re-education, Balance training, Gait training, Patient/Family education, Self Care, Joint mobilization, Stair training, Vestibular training, Canalith repositioning, DME instructions, Aquatic Therapy, Dry Needling, Cryotherapy, Moist heat, Taping, Manual therapy, and Re-evaluation  PLAN FOR NEXT SESSION: Continue gait training without cane; progress balance activities while weaning UE support to improve balance confidence -including hip/ankle/step strategies   Janene Harvey, PT, DPT 12/20/21 11:59 AM  [facility] at [facility] [address], [place],  [place] [address] Phone # [phone] Fax # [phone]

## 2021-12-22 ENCOUNTER — Ambulatory Visit: Payer: Medicare Other

## 2021-12-26 NOTE — Therapy (Signed)
OUTPATIENT PHYSICAL THERAPY NOTE   Patient Name: Emily Kelley MRN: [id number] DOB:[birth year], 71 y.o., female Today's Date: 12/27/2021  PCP: London Pepper, MD REFERRING PROVIDER: Alric Ran, MD    PT End of Session - 12/27/21 1151     Visit Number 11    Number of Visits 14    Date for PT Re-Evaluation 01/17/22    Authorization Type Medicare    PT Start Time 1104    PT Stop Time 1147    PT Time Calculation (min) 43 min    Equipment Utilized During Treatment Gait belt    Activity Tolerance Patient tolerated treatment well    Behavior During Therapy WFL for tasks assessed/performed                 Past Medical History:  Diagnosis Date   Allergic rhinitis, seasonal    Asthma    CHILDHOOD   HTN (hypertension)    Hypercalcemia    Hypothyroidism    Osteopenia    Sleep apnea    Past Surgical History:  Procedure Laterality Date   CATARACT EXTRACTION     HEMORRHOID SURGERY     x2 polyp   NASAL SEPTUM SURGERY     PARATHYROID EXPLORATION     RETINAL DETACHMENT SURGERY     TONSILLECTOMY AND ADENOIDECTOMY     Patient Active Problem List   Diagnosis Date Noted   Balance disorder 12/14/2021   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   Hypothyroidism 12/29/2009   Seasonal and perennial allergic rhinitis 01/06/2008   Obstructive sleep apnea 01/06/2008    ONSET DATE: 1 year and 3 months   REFERRING DIAG: R26.9 (ICD-10-CM) - Gait abnormality  THERAPY DIAG:  Unsteadiness on feet  Other abnormalities of gait and mobility  Dizziness and giddiness  Rationale for Evaluation and Treatment Rehabilitation  SUBJECTIVE:                                                                                                                                                                                              SUBJECTIVE STATEMENT: Has been working on walking while holding the cane but not putting it down.   Pt accompanied by: self  PERTINENT HISTORY: asthma,  hypothyroidism, parkinsonism   PAIN:  Are you having pain? No  PRECAUTIONS: Fall  PATIENT GOALS improve balance and dizziness   OBJECTIVE:    TODAY'S TREATMENT: 12/27/21 Activity Comments  Nustep L5 x 6 min Ues/Les  Cues to maintain 70-75 SPM  resisted march red TB 2x20 Good amplitude; cues for chest up  Sidestepping in II bars with red TB 2x 4x along bars Cues for upright  posture and avoid lateral lean   alt toe tap on 8" step 3# 3x20 Cues to widen BOS and for "quiet feet"  step downs + heel touch on 4" step 2x10 R LE  muscle fatigue; better control on L LE  SLS with ring toss 4x  Weaning UE support  Gait training outside without AD on grass, sidewalk 0.13 miles; 1x stepping up and down curbs Cueing to "just walk" and avoid hypervigilance; SBA-CGA            HOME EXERCISE PROGRAM Last updated: 12/20/21 Access Code: [email or web address] URL: [email or web address] Date: 12/20/2021 Prepared by: [facility]  Exercises - Standing Toe Taps  - 1 x daily - 5 x weekly - 2 sets - 10 reps - Lateral Weight Shift with Arm Raise  - 1 x daily - 5 x weekly - 2 sets - 10 reps - Romberg Stance on Foam Pad  - 1 x daily - 5 x weekly - 2 sets - 30 sec hold - Romberg Stance Eyes Closed on Foam Pad  - 1 x daily - 5 x weekly - 2 sets - 30 sec hold - Tandem Stance on Foam Pad with Eyes Open  - 1 x daily - 5 x weekly - 2 sets - 30 sec hold - Toe Walking with Counter Support  - 1 x daily - 5 x weekly - 2 sets - 5 reps - Heel Walking with Counter Support  - 1 x daily - 5 x weekly - 2 sets - 5 reps - Tandem Walking with Counter Support  - 1 x daily - 5 x weekly - 5 sets - Alternating Step Forward with Support  - 1 x daily - 5 x weekly - 2 sets - 10 reps - Forward Step Up  - 1 x daily - 5 x weekly - 2 sets - 10 reps - Marching with Resistance  - 1 x daily - 5 x weekly - 2 sets - 10 reps - Single Leg Stance with Support  - 1 x daily - 5 x weekly - 2 sets - 30  sec hold - Standing Tandem Balance with Counter Support  - 1 x daily - 5 x weekly - 2 sets - 30 sec hold   Below measures were taken at time of initial evaluation unless otherwise specified:  DIAGNOSTIC FINDINGS: pt reports her recent MRI was clear   COGNITION: Overall cognitive status: Within functional limits for tasks assessed   SENSATION: WFL  COORDINATION: Intact alt pronation/supination and finger to nose    POSTURE: forward head, rounded shoulder and thoracic spine; forward trunk flexion   LOWER EXTREMITY ROM:     Active  Right Eval Left Eval  Hip flexion    Hip extension    Hip abduction    Hip adduction    Hip internal rotation    Hip external rotation    Knee flexion    Knee extension    Ankle dorsiflexion 15 7  Ankle plantarflexion    Ankle inversion    Ankle eversion     (Blank rows = not tested)  LOWER EXTREMITY MMT:    MMT (in sitting) Right Eval Left Eval  Hip flexion 4+ 4+  Hip extension    Hip abduction 4 4+  Hip adduction 4+ 4+  Hip internal rotation    Hip external rotation    Knee flexion 4- 4+  Knee extension 4- 4+  Ankle dorsiflexion 4 4+  Ankle plantarflexion 4+ 4+  Ankle inversion    Ankle eversion    (Blank rows = not tested)  GAIT: Gait pattern:  Short steps without heel strike and audible shuffling; slow; trunk flexed   Assistive device utilized: Environmental consultant - 2 wheeled   FUNCTIONAL TESTs:       PATIENT EDUCATION: Education details: prognosis, POC, HEP; edu on importance of increasing balance confidence on function  Person educated: Patient Education method: Explanation, Demonstration, Tactile cues, Verbal cues, and Handouts Education comprehension: verbalized understanding and returned demonstration   HOME EXERCISE PROGRAM: Access Code: [email or web address] URL: [email or web address] Date: 11/18/2021 Prepared by: [facility]  Exercises - Mini Squat with Counter Support  - 1  x daily - 5 x weekly - 2 sets - 10 reps - Wide Stance with Counter Support  - 1 x daily - 5 x weekly - 2-3 sets - 10 reps - 30 sec hold - Narrow Stance with Counter Support  - 1 x daily - 5 x weekly - 2-3 sets - 30 sec hold - Standing Toe Taps  - 1 x daily - 5 x weekly - 2 sets - 10 reps - Seated Hamstring Curl with Anchored Resistance  - 1 x daily - 5 x weekly - 2 sets - 10 reps -Gobelt squat with kettlebell 3-5 sets 5-10 reps -suitcase carry/march: by side and front rack position   GOALS: Goals reviewed with patient? Yes  SHORT TERM GOALS: Target date: 12/09/2021  Patient to be independent with initial HEP. Baseline: HEP initiated Goal status: MET    LONG TERM GOALS: Target date: 01/17/2022  Patient to be independent with advanced HEP. Baseline: Not yet initiated  Goal status: IN PROGRESS  Patient to demonstrate R LE strength >/=4+/5.  Baseline: See above; 12/20/21 improving- see above Goal status: IN PROGRESS  Patient to report 70% improvement in balance confidence.  Baseline: reports poor balance confidence; 12/20/21 90% improvement Goal status: MET  Patient to complete TUG in <14 sec with LRAD in order to decrease risk of falls.   Baseline: 24.35 sec with RW; 14 & 17 sec 12/20/21 Goal status: IN PROGRESS  Patient to demonstrate 5xSTS test in <15 sec in order to decrease risk of falls.  Baseline: 17.1 sec; 12 sec 12/20/21 Goal status: MET  Patient to score at least 45/56 on Berg in order to decrease risk of falls.  Baseline: 39/56; 12/20/21 47/56 Goal status: MET  Patient to score at least 50/56 on Berg in order to decrease risk of falls.  Baseline: 39/56; 12/20/21 47/56 Goal status: IN PROGRESS   ASSESSMENT:  CLINICAL IMPRESSION: Patient arrived to session without new complaints. Worked on LE strengthening to address remaining strength deficits. Patient demonstrated good carryover over of cueing provided today. Demonstrated limited eccentric control with toe  taps, R>L LE. Marked R quad weakness evident with step downs for quad strengthening. Able to perform gait training outside without AD and with reduced guarding today with focus on walking "naturally" and reducing hypervigilance. Patient reported understanding of all edu provided and without complaints at end of session.     OBJECTIVE IMPAIRMENTS Abnormal gait, decreased activity tolerance, decreased balance, decreased strength, dizziness, and postural dysfunction.   ACTIVITY LIMITATIONS carrying, lifting, bending, standing, squatting, transfers, bathing, toileting, dressing, reach over head, hygiene/grooming, and caring for others  PARTICIPATION LIMITATIONS: meal prep, cleaning, laundry, shopping, community activity, and church  PERSONAL FACTORS Age, Behavior pattern, Fitness, Past/current experiences, Time since onset of injury/illness/exacerbation,  and 3+ comorbidities: asthma, hypothyroidism, parkinsonism   are also affecting patient's functional outcome.   REHAB POTENTIAL: Good  CLINICAL DECISION MAKING: Evolving/moderate complexity  EVALUATION COMPLEXITY: Moderate  PLAN: PT FREQUENCY: 1x/week  PT DURATION: 4 weeks  PLANNED INTERVENTIONS: Therapeutic exercises, Therapeutic activity, Neuromuscular re-education, Balance training, Gait training, Patient/Family education, Self Care, Joint mobilization, Stair training, Vestibular training, Canalith repositioning, DME instructions, Aquatic Therapy, Dry Needling, Cryotherapy, Moist heat, Taping, Manual therapy, and Re-evaluation  PLAN FOR NEXT SESSION: Continue gait training without cane; progress balance activities while weaning UE support to improve balance confidence -including hip/ankle/step strategies   Janene Harvey, PT, DPT 12/27/21 11:53 AM  [facility] at [facility] [address] [place], [place] [address] Phone # [phone] Fax # [phone]

## 2021-12-27 ENCOUNTER — Ambulatory Visit: Payer: Medicare Other | Admitting: Physical Therapy

## 2021-12-27 ENCOUNTER — Encounter: Payer: Self-pay | Admitting: Physical Therapy

## 2021-12-27 DIAGNOSIS — R2681 Unsteadiness on feet: Secondary | ICD-10-CM

## 2021-12-27 DIAGNOSIS — R2689 Other abnormalities of gait and mobility: Secondary | ICD-10-CM

## 2021-12-27 DIAGNOSIS — R42 Dizziness and giddiness: Secondary | ICD-10-CM

## 2021-12-29 ENCOUNTER — Ambulatory Visit: Payer: 59 | Admitting: Internal Medicine

## 2021-12-29 ENCOUNTER — Ambulatory Visit: Payer: Medicare Other | Admitting: Physical Therapy

## 2022-01-02 NOTE — Therapy (Signed)
OUTPATIENT PHYSICAL THERAPY NOTE   Patient Name: Emily Kelley MRN: [id number] DOB:12-16-1950, 71 y.o., female Today's Date: 01/03/2022  PCP: London Pepper, MD REFERRING PROVIDER: Alric Ran, MD    PT End of Session - 01/03/22 1142     Visit Number 12    Number of Visits 14    Date for PT Re-Evaluation 01/17/22    Authorization Type Medicare    PT Start Time 1104    PT Stop Time 1142    PT Time Calculation (min) 38 min    Equipment Utilized During Treatment Gait belt    Activity Tolerance Patient tolerated treatment well    Behavior During Therapy WFL for tasks assessed/performed                  Past Medical History:  Diagnosis Date   Allergic rhinitis, seasonal    Asthma    CHILDHOOD   HTN (hypertension)    Hypercalcemia    Hypothyroidism    Osteopenia    Sleep apnea    Past Surgical History:  Procedure Laterality Date   CATARACT EXTRACTION     HEMORRHOID SURGERY     x2 polyp   NASAL SEPTUM SURGERY     PARATHYROID EXPLORATION     RETINAL DETACHMENT SURGERY     TONSILLECTOMY AND ADENOIDECTOMY     Patient Active Problem List   Diagnosis Date Noted   Balance disorder 12/14/2021   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   Hypothyroidism 12/29/2009   Seasonal and perennial allergic rhinitis 01/06/2008   Obstructive sleep apnea 01/06/2008    ONSET DATE: 1 year and 3 months   REFERRING DIAG: R26.9 (ICD-10-CM) - Gait abnormality  THERAPY DIAG:  Unsteadiness on feet  Other abnormalities of gait and mobility  Dizziness and giddiness  Rationale for Evaluation and Treatment Rehabilitation  SUBJECTIVE:                                                                                                                                                                                              SUBJECTIVE STATEMENT: Continues to practice walking with/without the cane.   Pt accompanied by: self  PERTINENT HISTORY: asthma, hypothyroidism, parkinsonism    PAIN:  Are you having pain? No  PRECAUTIONS: Fall  PATIENT GOALS improve balance and dizziness   OBJECTIVE:     TODAY'S TREATMENT: 01/03/22 Activity Comments  Nustep L6 x 6 min Ues/LEs Cueing to maintain 70s SPM  standing on foam beam ant/pos wt shifts Occasional reaching strategy; encouraged ankle strategy and maintaining trunk still  standing on foam beam + multidirectional perturbations Frequent reaching strategy   resisted backwards  walking 5x 20# and sidestepping with pulley Cueing to widen BOS, increased step length   Gait training without SPC Distant supervision to improve confidence; cues to "just walk" and increase pace  stepping up and down curbs with/without SPC  Improved stability stepping down with L foot stabilizing      HOME EXERCISE PROGRAM Last updated: 12/20/21 Access Code: [email or web address] URL: [email or web address] Date: 12/20/2021 Prepared by: [facility]  Exercises - Standing Toe Taps  - 1 x daily - 5 x weekly - 2 sets - 10 reps - Lateral Weight Shift with Arm Raise  - 1 x daily - 5 x weekly - 2 sets - 10 reps - Romberg Stance on Foam Pad  - 1 x daily - 5 x weekly - 2 sets - 30 sec hold - Romberg Stance Eyes Closed on Foam Pad  - 1 x daily - 5 x weekly - 2 sets - 30 sec hold - Tandem Stance on Foam Pad with Eyes Open  - 1 x daily - 5 x weekly - 2 sets - 30 sec hold - Toe Walking with Counter Support  - 1 x daily - 5 x weekly - 2 sets - 5 reps - Heel Walking with Counter Support  - 1 x daily - 5 x weekly - 2 sets - 5 reps - Tandem Walking with Counter Support  - 1 x daily - 5 x weekly - 5 sets - Alternating Step Forward with Support  - 1 x daily - 5 x weekly - 2 sets - 10 reps - Forward Step Up  - 1 x daily - 5 x weekly - 2 sets - 10 reps - Marching with Resistance  - 1 x daily - 5 x weekly - 2 sets - 10 reps - Single Leg Stance with Support  - 1 x daily - 5 x weekly - 2 sets - 30 sec hold - Standing Tandem  Balance with Counter Support  - 1 x daily - 5 x weekly - 2 sets - 30 sec hold   Below measures were taken at time of initial evaluation unless otherwise specified:  DIAGNOSTIC FINDINGS: pt reports her recent MRI was clear   COGNITION: Overall cognitive status: Within functional limits for tasks assessed   SENSATION: WFL  COORDINATION: Intact alt pronation/supination and finger to nose    POSTURE: forward head, rounded shoulder and thoracic spine; forward trunk flexion   LOWER EXTREMITY ROM:     Active  Right Eval Left Eval  Hip flexion    Hip extension    Hip abduction    Hip adduction    Hip internal rotation    Hip external rotation    Knee flexion    Knee extension    Ankle dorsiflexion 15 7  Ankle plantarflexion    Ankle inversion    Ankle eversion     (Blank rows = not tested)  LOWER EXTREMITY MMT:    MMT (in sitting) Right Eval Left Eval  Hip flexion 4+ 4+  Hip extension    Hip abduction 4 4+  Hip adduction 4+ 4+  Hip internal rotation    Hip external rotation    Knee flexion 4- 4+  Knee extension 4- 4+  Ankle dorsiflexion 4 4+  Ankle plantarflexion 4+ 4+  Ankle inversion    Ankle eversion    (Blank rows = not tested)  GAIT: Gait pattern:  Short steps without heel strike and audible shuffling; slow;  trunk flexed   Assistive device utilized: Environmental consultant - 2 wheeled   FUNCTIONAL TESTs:       PATIENT EDUCATION: Education details: prognosis, POC, HEP; edu on importance of increasing balance confidence on function  Person educated: Patient Education method: Explanation, Demonstration, Tactile cues, Verbal cues, and Handouts Education comprehension: verbalized understanding and returned demonstration   HOME EXERCISE PROGRAM: Access Code: [email or web address] URL: [email or web address] Date: 11/18/2021 Prepared by: [facility]  Exercises - Mini Squat with Counter Support  - 1 x daily - 5 x weekly - 2  sets - 10 reps - Wide Stance with Counter Support  - 1 x daily - 5 x weekly - 2-3 sets - 10 reps - 30 sec hold - Narrow Stance with Counter Support  - 1 x daily - 5 x weekly - 2-3 sets - 30 sec hold - Standing Toe Taps  - 1 x daily - 5 x weekly - 2 sets - 10 reps - Seated Hamstring Curl with Anchored Resistance  - 1 x daily - 5 x weekly - 2 sets - 10 reps -Gobelt squat with kettlebell 3-5 sets 5-10 reps -suitcase carry/march: by side and front rack position   GOALS: Goals reviewed with patient? Yes  SHORT TERM GOALS: Target date: 12/09/2021  Patient to be independent with initial HEP. Baseline: HEP initiated Goal status: MET    LONG TERM GOALS: Target date: 01/17/2022  Patient to be independent with advanced HEP. Baseline: Not yet initiated  Goal status: IN PROGRESS  Patient to demonstrate R LE strength >/=4+/5.  Baseline: See above; 12/20/21 improving- see above Goal status: IN PROGRESS  Patient to report 70% improvement in balance confidence.  Baseline: reports poor balance confidence; 12/20/21 90% improvement Goal status: MET  Patient to complete TUG in <14 sec with LRAD in order to decrease risk of falls.   Baseline: 24.35 sec with RW; 14 & 17 sec 12/20/21 Goal status: IN PROGRESS  Patient to demonstrate 5xSTS test in <15 sec in order to decrease risk of falls.  Baseline: 17.1 sec; 12 sec 12/20/21 Goal status: MET  Patient to score at least 45/56 on Berg in order to decrease risk of falls.  Baseline: 39/56; 12/20/21 47/56 Goal status: MET  Patient to score at least 50/56 on Berg in order to decrease risk of falls.  Baseline: 39/56; 12/20/21 47/56 Goal status: IN PROGRESS   ASSESSMENT:  CLINICAL IMPRESSION: Patient arrived to session without complaints. Worked on reactive and anticipatory balance on compliant surface, with cueing to encourage ankle rather than reaching strategy. Resisted walking revealed some hesitancy and short step length which improved with  practice. Worked on increasing confidence with gait and curb negotiation without AD- which patient performed safely. Patient reported understanding of all edu provided and without complaints at end of session.      OBJECTIVE IMPAIRMENTS Abnormal gait, decreased activity tolerance, decreased balance, decreased strength, dizziness, and postural dysfunction.   ACTIVITY LIMITATIONS carrying, lifting, bending, standing, squatting, transfers, bathing, toileting, dressing, reach over head, hygiene/grooming, and caring for others  PARTICIPATION LIMITATIONS: meal prep, cleaning, laundry, shopping, community activity, and church  PERSONAL FACTORS Age, Behavior pattern, Fitness, Past/current experiences, Time since onset of injury/illness/exacerbation, and 3+ comorbidities: asthma, hypothyroidism, parkinsonism   are also affecting patient's functional outcome.   REHAB POTENTIAL: Good  CLINICAL DECISION MAKING: Evolving/moderate complexity  EVALUATION COMPLEXITY: Moderate  PLAN: PT FREQUENCY: 1x/week  PT DURATION: 4 weeks  PLANNED INTERVENTIONS: Therapeutic exercises, Therapeutic activity, Neuromuscular  re-education, Balance training, Gait training, Patient/Family education, Self Care, Joint mobilization, Stair training, Vestibular training, Canalith repositioning, DME instructions, Aquatic Therapy, Dry Needling, Cryotherapy, Moist heat, Taping, Manual therapy, and Re-evaluation  PLAN FOR NEXT SESSION: Continue gait training without cane; progress balance activities while weaning UE support to improve balance confidence -including hip/ankle/step strategies   Janene Harvey, PT, DPT 01/03/22 11:45 AM  [facility] at [facility] [address] [place], [place] [address] Phone # [phone] Fax # [phone]

## 2022-01-03 ENCOUNTER — Encounter: Payer: Self-pay | Admitting: Physical Therapy

## 2022-01-03 ENCOUNTER — Ambulatory Visit: Payer: Medicare Other | Admitting: Physical Therapy

## 2022-01-03 DIAGNOSIS — R42 Dizziness and giddiness: Secondary | ICD-10-CM

## 2022-01-03 DIAGNOSIS — R2681 Unsteadiness on feet: Secondary | ICD-10-CM | POA: Diagnosis not present

## 2022-01-03 DIAGNOSIS — R2689 Other abnormalities of gait and mobility: Secondary | ICD-10-CM

## 2022-01-09 NOTE — Therapy (Signed)
OUTPATIENT PHYSICAL THERAPY NOTE   Patient Name: Emily Kelley MRN: [id number] DOB:04/07/1951, 70 y.o., female Today's Date: 01/10/2022  PCP: Morrow, Aaron, MD REFERRING PROVIDER: Camara, Amadou, MD    PT End of Session - 01/10/22 1139     Visit Number 13    Number of Visits 14    Date for PT Re-Evaluation 01/17/22    Authorization Type Medicare    PT Start Time 1100    PT Stop Time 1139    PT Time Calculation (min) 39 min    Equipment Utilized During Treatment Gait belt    Activity Tolerance Patient tolerated treatment well    Behavior During Therapy WFL for tasks assessed/performed                   Past Medical History:  Diagnosis Date   Allergic rhinitis, seasonal    Asthma    CHILDHOOD   HTN (hypertension)    Hypercalcemia    Hypothyroidism    Osteopenia    Sleep apnea    Past Surgical History:  Procedure Laterality Date   CATARACT EXTRACTION     HEMORRHOID SURGERY     x2 polyp   NASAL SEPTUM SURGERY     PARATHYROID EXPLORATION     RETINAL DETACHMENT SURGERY     TONSILLECTOMY AND ADENOIDECTOMY     Patient Active Problem List   Diagnosis Date Noted   Balance disorder 12/14/2021   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   Hypothyroidism 12/29/2009   Seasonal and perennial allergic rhinitis 01/06/2008   Obstructive sleep apnea 01/06/2008    ONSET DATE: 1 year and 3 months   REFERRING DIAG: R26.9 (ICD-10-CM) - Gait abnormality  THERAPY DIAG:  Unsteadiness on feet  Other abnormalities of gait and mobility  Dizziness and giddiness  Rationale for Evaluation and Treatment Rehabilitation  SUBJECTIVE:                                                                                                                                                                                              SUBJECTIVE STATEMENT: Has been trying to turn her head while walking.   Pt accompanied by: self  PERTINENT HISTORY: asthma, hypothyroidism, parkinsonism    PAIN:  Are you having pain? Yes: NPRS scale: 2/10 Pain location: L knee Pain description: achey Aggravating factors: walking Relieving factors: meds  PRECAUTIONS: Fall  PATIENT GOALS improve balance and dizziness   OBJECTIVE:      TODAY'S TREATMENT: 01/10/22 Activity Comments  L6 x 6 min Ues/Les  Maintaining ~50-60 SPM  walking + ball toss 3x70ft  Cues to increase amplitude of ball toss    backwards walking x70 ft Cues to increase step length   gait training without AD including figure 8 turns, sidestepping over cones, sidestepping up on foam, pivot turns, shadowing PT's speed Cueing to increase step length with turns- good carryover; cueing for proper foot placement to set up for stepping over cone; CGA  stair navigation with/without handrail Good stability and safe foot placement  gait training outside on sidewalk, grass, hills without AD 0.13 miles Supervision throughout; good stability but remaining shortened step length           HOME EXERCISE PROGRAM Last updated: 01/10/22 Access Code: [email or web address] URL: [email or web address] Date: 01/10/2022 Prepared by: [facility] - [facility]  Exercises - Standing Toe Taps  - 1 x daily - 5 x weekly - 2 sets - 10 reps - Lateral Weight Shift with Arm Raise  - 1 x daily - 5 x weekly - 2 sets - 10 reps - Romberg Stance on Foam Pad  - 1 x daily - 5 x weekly - 2 sets - 30 sec hold - Romberg Stance Eyes Closed on Foam Pad  - 1 x daily - 5 x weekly - 2 sets - 30 sec hold - Tandem Stance on Foam Pad with Eyes Open  - 1 x daily - 5 x weekly - 2 sets - 30 sec hold - Toe Walking with Counter Support  - 1 x daily - 5 x weekly - 2 sets - 5 reps - Heel Walking with Counter Support  - 1 x daily - 5 x weekly - 2 sets - 5 reps - Tandem Walking with Counter Support  - 1 x daily - 5 x weekly - 5 sets - Alternating Step Forward with Support  - 1 x daily - 5 x weekly - 2 sets - 10 reps - Forward Step Up  - 1 x daily  - 5 x weekly - 2 sets - 10 reps - Marching with Resistance  - 1 x daily - 5 x weekly - 2 sets - 10 reps - Single Leg Stance with Support  - 1 x daily - 5 x weekly - 2 sets - 30 sec hold - Standing Tandem Balance with Counter Support  - 1 x daily - 5 x weekly - 2 sets - 30 sec hold - Ball Toss with Eye Tracking While Walking  - 1 x daily - 5 x weekly - 2 sets - 10 reps - Side Step Overs with Cones and Unilateral Counter Support  - 1 x daily - 5 x weekly - 2 sets - 10 reps   PATIENT EDUCATION: Education details: HEP update; discussion on anticipated DC next session Person educated: Patient Education method: Explanation, Demonstration, Tactile cues, Verbal cues, and Handouts Education comprehension: verbalized understanding and returned demonstration    Below measures were taken at time of initial evaluation unless otherwise specified:  DIAGNOSTIC FINDINGS: pt reports her recent MRI was clear   COGNITION: Overall cognitive status: Within functional limits for tasks assessed   SENSATION: WFL  COORDINATION: Intact alt pronation/supination and finger to nose    POSTURE: forward head, rounded shoulder and thoracic spine; forward trunk flexion   LOWER EXTREMITY ROM:     Active  Right Eval Left Eval  Hip flexion    Hip extension    Hip abduction    Hip adduction    Hip internal rotation    Hip external rotation    Knee flexion    Knee extension      Ankle dorsiflexion 15 7  Ankle plantarflexion    Ankle inversion    Ankle eversion     (Blank rows = not tested)  LOWER EXTREMITY MMT:    MMT (in sitting) Right Eval Left Eval  Hip flexion 4+ 4+  Hip extension    Hip abduction 4 4+  Hip adduction 4+ 4+  Hip internal rotation    Hip external rotation    Knee flexion 4- 4+  Knee extension 4- 4+  Ankle dorsiflexion 4 4+  Ankle plantarflexion 4+ 4+  Ankle inversion    Ankle eversion    (Blank rows = not tested)  GAIT: Gait pattern:  Short steps without heel strike  and audible shuffling; slow; trunk flexed   Assistive device utilized: Walker - 2 wheeled   FUNCTIONAL TESTs:       PATIENT EDUCATION: Education details: prognosis, POC, HEP; edu on importance of increasing balance confidence on function  Person educated: Patient Education method: Explanation, Demonstration, Tactile cues, Verbal cues, and Handouts Education comprehension: verbalized understanding and returned demonstration   HOME EXERCISE PROGRAM: Access Code: [email or web address] URL: [email or web address] Date: 11/18/2021 Prepared by: [facility] - [facility]  Exercises - Mini Squat with Counter Support  - 1 x daily - 5 x weekly - 2 sets - 10 reps - Wide Stance with Counter Support  - 1 x daily - 5 x weekly - 2-3 sets - 10 reps - 30 sec hold - Narrow Stance with Counter Support  - 1 x daily - 5 x weekly - 2-3 sets - 30 sec hold - Standing Toe Taps  - 1 x daily - 5 x weekly - 2 sets - 10 reps - Seated Hamstring Curl with Anchored Resistance  - 1 x daily - 5 x weekly - 2 sets - 10 reps -Gobelt squat with kettlebell 3-5 sets 5-10 reps -suitcase carry/march: by side and front rack position   GOALS: Goals reviewed with patient? Yes  SHORT TERM GOALS: Target date: 12/09/2021  Patient to be independent with initial HEP. Baseline: HEP initiated Goal status: MET    LONG TERM GOALS: Target date: 01/17/2022  Patient to be independent with advanced HEP. Baseline: Not yet initiated  Goal status: IN PROGRESS  Patient to demonstrate R LE strength >/=4+/5.  Baseline: See above; 12/20/21 improving- see above Goal status: IN PROGRESS  Patient to report 70% improvement in balance confidence.  Baseline: reports poor balance confidence; 12/20/21 90% improvement Goal status: MET  Patient to complete TUG in <14 sec with LRAD in order to decrease risk of falls.   Baseline: 24.35 sec with RW; 14 & 17 sec 12/20/21 Goal status: IN PROGRESS  Patient to  demonstrate 5xSTS test in <15 sec in order to decrease risk of falls.  Baseline: 17.1 sec; 12 sec 12/20/21 Goal status: MET  Patient to score at least 45/56 on Berg in order to decrease risk of falls.  Baseline: 39/56; 12/20/21 47/56 Goal status: MET  Patient to score at least 50/56 on Berg in order to decrease risk of falls.  Baseline: 39/56; 12/20/21 47/56 Goal status: IN PROGRESS   ASSESSMENT:  CLINICAL IMPRESSION: Patient arrived to session with report of mild L knee pain. Worked on dynamic balance activities with speed and turning challenges. Patient required cueing to elongate steps and increase confidence with turning. Also trialed stair navigation which was performed with good stability and confidence. Patient tolerated gait training on outdoor surfaces with distant supervision with good stability but   still some remaining reduced step length. Spoke to patient about anticipated DC next session d/t progress with therapy. Patient in agreement and without complaints at end of session.      OBJECTIVE IMPAIRMENTS Abnormal gait, decreased activity tolerance, decreased balance, decreased strength, dizziness, and postural dysfunction.   ACTIVITY LIMITATIONS carrying, lifting, bending, standing, squatting, transfers, bathing, toileting, dressing, reach over head, hygiene/grooming, and caring for others  PARTICIPATION LIMITATIONS: meal prep, cleaning, laundry, shopping, community activity, and church  PERSONAL FACTORS Age, Behavior pattern, Fitness, Past/current experiences, Time since onset of injury/illness/exacerbation, and 3+ comorbidities: asthma, hypothyroidism, parkinsonism   are also affecting patient's functional outcome.   REHAB POTENTIAL: Good  CLINICAL DECISION MAKING: Evolving/moderate complexity  EVALUATION COMPLEXITY: Moderate  PLAN: PT FREQUENCY: 1x/week  PT DURATION: 4 weeks  PLANNED INTERVENTIONS: Therapeutic exercises, Therapeutic activity, Neuromuscular  re-education, Balance training, Gait training, Patient/Family education, Self Care, Joint mobilization, Stair training, Vestibular training, Canalith repositioning, DME instructions, Aquatic Therapy, Dry Needling, Cryotherapy, Moist heat, Taping, Manual therapy, and Re-evaluation  PLAN FOR NEXT SESSION: DC   Janene Harvey, PT, DPT 01/10/22 11:43 AM  [facility] at [facility] [address] [place], [place] [address] Phone # [phone] Fax # [phone]

## 2022-01-10 ENCOUNTER — Encounter: Payer: Self-pay | Admitting: Physical Therapy

## 2022-01-10 ENCOUNTER — Ambulatory Visit: Payer: Medicare Other | Attending: Neurology | Admitting: Physical Therapy

## 2022-01-10 DIAGNOSIS — R2681 Unsteadiness on feet: Secondary | ICD-10-CM | POA: Insufficient documentation

## 2022-01-10 DIAGNOSIS — R42 Dizziness and giddiness: Secondary | ICD-10-CM | POA: Diagnosis present

## 2022-01-10 DIAGNOSIS — R2689 Other abnormalities of gait and mobility: Secondary | ICD-10-CM | POA: Insufficient documentation

## 2022-01-16 NOTE — Therapy (Signed)
OUTPATIENT PHYSICAL THERAPY DISCHARGE SUMMARY   Patient Name: Emily Kelley MRN: [id number] DOB:Jan 19, 1951, 71 y.o., female Today's Date: 01/16/2022  PCP: London Pepper, MD REFERRING PROVIDER: Alric Ran, MD            Past Medical History:  Diagnosis Date   Allergic rhinitis, seasonal    Asthma    CHILDHOOD   HTN (hypertension)    Hypercalcemia    Hypothyroidism    Osteopenia    Sleep apnea    Past Surgical History:  Procedure Laterality Date   CATARACT EXTRACTION     HEMORRHOID SURGERY     x2 polyp   NASAL SEPTUM SURGERY     PARATHYROID EXPLORATION     RETINAL DETACHMENT SURGERY     TONSILLECTOMY AND ADENOIDECTOMY     Patient Active Problem List   Diagnosis Date Noted   Balance disorder 12/14/2021   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   Hypothyroidism 12/29/2009   Seasonal and perennial allergic rhinitis 01/06/2008   Obstructive sleep apnea 01/06/2008    ONSET DATE: 1 year and 3 months   REFERRING DIAG: R26.9 (ICD-10-CM) - Gait abnormality  THERAPY DIAG:  No diagnosis found.  Rationale for Evaluation and Treatment Rehabilitation  SUBJECTIVE:                                                                                                                                                                                              SUBJECTIVE STATEMENT: Has been trying to turn her head while walking.   Pt accompanied by: self  PERTINENT HISTORY: asthma, hypothyroidism, parkinsonism   PAIN:  Are you having pain? Yes: NPRS scale: 2/10 Pain location: L knee Pain description: achey Aggravating factors: walking Relieving factors: meds  PRECAUTIONS: Fall  PATIENT GOALS improve balance and dizziness   OBJECTIVE:     TODAY'S TREATMENT: 01/17/22 Activity Comments                       HOME EXERCISE PROGRAM Last updated: 01/10/22 Access Code: [email or web address] URL: [email or web address] Date: 01/10/2022 Prepared by: [facility]  Exercises - Standing Toe Taps  - 1 x daily - 5 x weekly - 2 sets - 10 reps - Lateral Weight Shift with Arm Raise  - 1 x daily - 5 x weekly - 2 sets - 10 reps - Romberg Stance on Foam Pad  - 1 x daily - 5 x weekly - 2 sets - 30 sec hold - Romberg Stance Eyes Closed on Foam Pad  - 1 x daily - 5 x weekly - 2 sets -  30 sec hold - Tandem Stance on Foam Pad with Eyes Open  - 1 x daily - 5 x weekly - 2 sets - 30 sec hold - Toe Walking with Counter Support  - 1 x daily - 5 x weekly - 2 sets - 5 reps - Heel Walking with Counter Support  - 1 x daily - 5 x weekly - 2 sets - 5 reps - Tandem Walking with Counter Support  - 1 x daily - 5 x weekly - 5 sets - Alternating Step Forward with Support  - 1 x daily - 5 x weekly - 2 sets - 10 reps - Forward Step Up  - 1 x daily - 5 x weekly - 2 sets - 10 reps - Marching with Resistance  - 1 x daily - 5 x weekly - 2 sets - 10 reps - Single Leg Stance with Support  - 1 x daily - 5 x weekly - 2 sets - 30 sec hold - Standing Tandem Balance with Counter Support  - 1 x daily - 5 x weekly - 2 sets - 30 sec hold - Ball Toss with Eye Tracking While Walking  - 1 x daily - 5 x weekly - 2 sets - 10 reps - Side Step Overs with Cones and Unilateral Counter Support  - 1 x daily - 5 x weekly - 2 sets - 10 reps     Below measures were taken at time of initial evaluation unless otherwise specified:  DIAGNOSTIC FINDINGS: pt reports her recent MRI was clear   COGNITION: Overall cognitive status: Within functional limits for tasks assessed   SENSATION: WFL  COORDINATION: Intact alt pronation/supination and finger to nose    POSTURE: forward head, rounded shoulder and thoracic spine; forward trunk flexion   LOWER EXTREMITY ROM:     Active  Right Eval Left Eval  Hip flexion    Hip extension    Hip abduction    Hip adduction    Hip internal rotation    Hip external rotation    Knee flexion    Knee extension    Ankle  dorsiflexion 15 7  Ankle plantarflexion    Ankle inversion    Ankle eversion     (Blank rows = not tested)  LOWER EXTREMITY MMT:    MMT (in sitting) Right Eval Left Eval  Hip flexion 4+ 4+  Hip extension    Hip abduction 4 4+  Hip adduction 4+ 4+  Hip internal rotation    Hip external rotation    Knee flexion 4- 4+  Knee extension 4- 4+  Ankle dorsiflexion 4 4+  Ankle plantarflexion 4+ 4+  Ankle inversion    Ankle eversion    (Blank rows = not tested)  GAIT: Gait pattern:  Short steps without heel strike and audible shuffling; slow; trunk flexed   Assistive device utilized: Environmental consultant - 2 wheeled   FUNCTIONAL TESTs:       PATIENT EDUCATION: Education details: prognosis, POC, HEP; edu on importance of increasing balance confidence on function  Person educated: Patient Education method: Explanation, Demonstration, Tactile cues, Verbal cues, and Handouts Education comprehension: verbalized understanding and returned demonstration   HOME EXERCISE PROGRAM: Access Code: [email or web address] URL: [email or web address] Date: 11/18/2021 Prepared by: [place] with Counter Support  - 1 x daily - 5 x weekly - 2 sets - 10 reps - Wide Stance with Counter Support  - 1 x  daily - 5 x weekly - 2-3 sets - 10 reps - 30 sec hold - Narrow Stance with Counter Support  - 1 x daily - 5 x weekly - 2-3 sets - 30 sec hold - Standing Toe Taps  - 1 x daily - 5 x weekly - 2 sets - 10 reps - Seated Hamstring Curl with Anchored Resistance  - 1 x daily - 5 x weekly - 2 sets - 10 reps -Gobelt squat with kettlebell 3-5 sets 5-10 reps -suitcase carry/march: by side and front rack position   GOALS: Goals reviewed with patient? Yes  SHORT TERM GOALS: Target date: 12/09/2021  Patient to be independent with initial HEP. Baseline: HEP initiated Goal status: MET    LONG TERM GOALS: Target date: 01/17/2022  Patient to be  independent with advanced HEP. Baseline: Not yet initiated  Goal status: IN PROGRESS  Patient to demonstrate R LE strength >/=4+/5.  Baseline: See above; 12/20/21 improving- see above Goal status: IN PROGRESS  Patient to report 70% improvement in balance confidence.  Baseline: reports poor balance confidence; 12/20/21 90% improvement Goal status: MET  Patient to complete TUG in <14 sec with LRAD in order to decrease risk of falls.   Baseline: 24.35 sec with RW; 14 & 17 sec 12/20/21 Goal status: IN PROGRESS  Patient to demonstrate 5xSTS test in <15 sec in order to decrease risk of falls.  Baseline: 17.1 sec; 12 sec 12/20/21 Goal status: MET  Patient to score at least 45/56 on Berg in order to decrease risk of falls.  Baseline: 39/56; 12/20/21 47/56 Goal status: MET  Patient to score at least 50/56 on Berg in order to decrease risk of falls.  Baseline: 39/56; 12/20/21 47/56 Goal status: IN PROGRESS   ASSESSMENT:  CLINICAL IMPRESSION: Patient arrived to session with report of mild L knee pain. Worked on dynamic balance activities with speed and turning challenges. Patient required cueing to elongate steps and increase confidence with turning. Also trialed stair navigation which was performed with good stability and confidence. Patient tolerated gait training on outdoor surfaces with distant supervision with good stability but still some remaining reduced step length. Spoke to patient about anticipated DC next session d/t progress with therapy. Patient in agreement and without complaints at end of session.      OBJECTIVE IMPAIRMENTS Abnormal gait, decreased activity tolerance, decreased balance, decreased strength, dizziness, and postural dysfunction.   ACTIVITY LIMITATIONS carrying, lifting, bending, standing, squatting, transfers, bathing, toileting, dressing, reach over head, hygiene/grooming, and caring for others  PARTICIPATION LIMITATIONS: meal prep, cleaning, laundry,  shopping, community activity, and church  PERSONAL FACTORS Age, Behavior pattern, Fitness, Past/current experiences, Time since onset of injury/illness/exacerbation, and 3+ comorbidities: asthma, hypothyroidism, parkinsonism   are also affecting patient's functional outcome.   REHAB POTENTIAL: Good  CLINICAL DECISION MAKING: Evolving/moderate complexity  EVALUATION COMPLEXITY: Moderate  PLAN: PT FREQUENCY: 1x/week  PT DURATION: 4 weeks  PLANNED INTERVENTIONS: Therapeutic exercises, Therapeutic activity, Neuromuscular re-education, Balance training, Gait training, Patient/Family education, Self Care, Joint mobilization, Stair training, Vestibular training, Canalith repositioning, DME instructions, Aquatic Therapy, Dry Needling, Cryotherapy, Moist heat, Taping, Manual therapy, and Re-evaluation  PLAN FOR NEXT SESSION: DC   Janene Harvey, PT, DPT 01/16/22 4:50 PM  [facility] [address], [place], [place] [address] Phone # [phone] Fax # [phone]

## 2022-01-17 ENCOUNTER — Ambulatory Visit: Payer: Medicare Other | Admitting: Physical Therapy

## 2022-01-17 ENCOUNTER — Encounter: Payer: Self-pay | Admitting: Physical Therapy

## 2022-01-17 DIAGNOSIS — R2681 Unsteadiness on feet: Secondary | ICD-10-CM | POA: Diagnosis not present

## 2022-01-17 DIAGNOSIS — R2689 Other abnormalities of gait and mobility: Secondary | ICD-10-CM

## 2022-01-17 DIAGNOSIS — R42 Dizziness and giddiness: Secondary | ICD-10-CM

## 2022-01-17 IMAGING — MR MR HEAD WO/W CM
13 series · 48 of 48 positions shown · IV contrast (multihance)
Comparison: None.

CLINICAL DATA: Gait difficulty. Patient reports she feels unsteady
and she is going to lose her balance and fall.

EXAM:
MRI HEAD WITHOUT AND WITH CONTRAST
TECHNIQUE: Multiplanar, multiecho pulse sequences of the brain and surrounding
structures were obtained without and with intravenous contrast.
CONTRAST:  15mL MULTIHANCE GADOBENATE DIMEGLUMINE 529 MG/ML IV SOLN

[Series 2: T1 · sagittal · 5.0mm · 0.45mm/px · 1 of 25 slices shown]
[im 1/25]
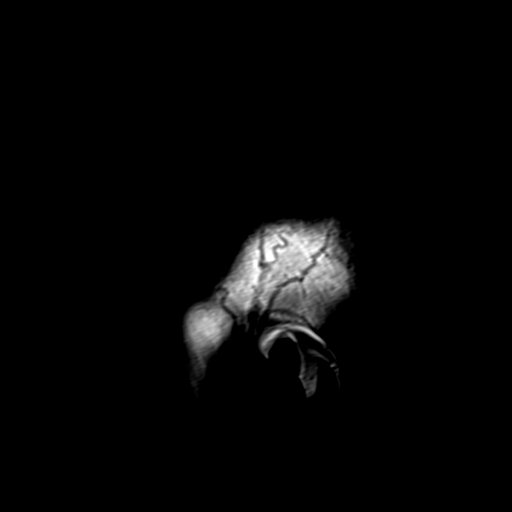

[Series 3: ax ep2d_diff_3 · axial · 3.0mm · 1.80mm/px · z∈[-38,+108]mm · 5 of 103 slices shown]
[im 1/103]
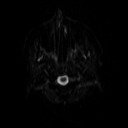
[im 26/103]
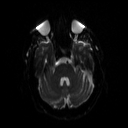
[im 52/103]
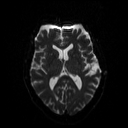
[im 77/103]
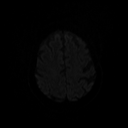
[im 103/103]
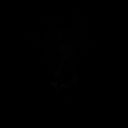

[Series 4: ax ep2d_diff_3_adc · axial · 3.0mm · 1.80mm/px · z∈[-38,+108]mm · 3 of 53 slices shown]
[im 1/53]
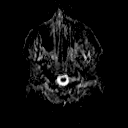
[im 27/53]
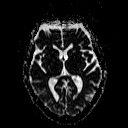
[im 53/53]
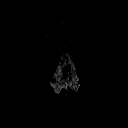

[Series 5: cor ep2d_diff · coronal · 5.0mm · 1.77mm/px · 3 of 58 slices shown]
[im 1/58]
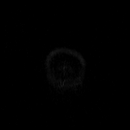
[im 29/58]
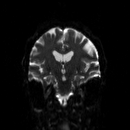
[im 58/58]
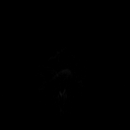

[Series 6: cor ep2d_diff_adc · coronal · 5.0mm · 1.77mm/px · 2 of 29 slices shown]
[im 1/29]
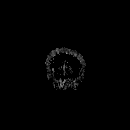
[im 29/29]
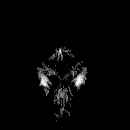

[Series 7: mip_images(sw) · axial · 16.0mm · 0.98mm/px · z∈[-28,+108]mm · 4 of 73 slices shown]
[im 1/73]
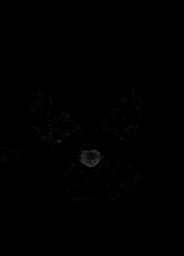
[im 25/73]
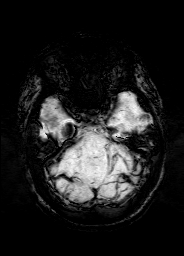
[im 49/73]
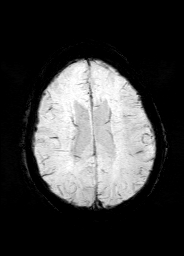
[im 73/73]
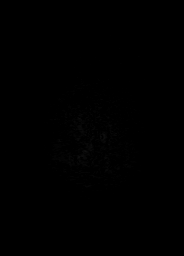

[Series 8: swi_images · axial · 2.0mm · 0.98mm/px · z∈[-34,+115]mm · 4 of 80 slices shown]
[im 1/80]
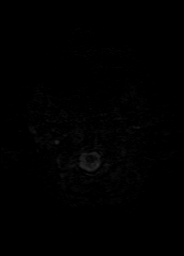
[im 27/80]
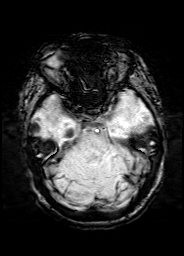
[im 53/80]
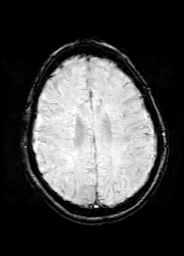
[im 80/80]
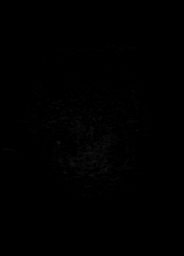

[Series 9: FLAIR · axial · 3.0mm · 0.43mm/px · z∈[-43,+111]mm · 2 of 43 slices shown]
[im 1/43]
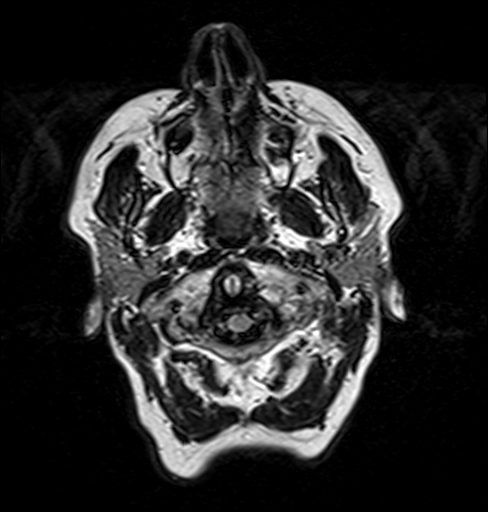
[im 43/43]
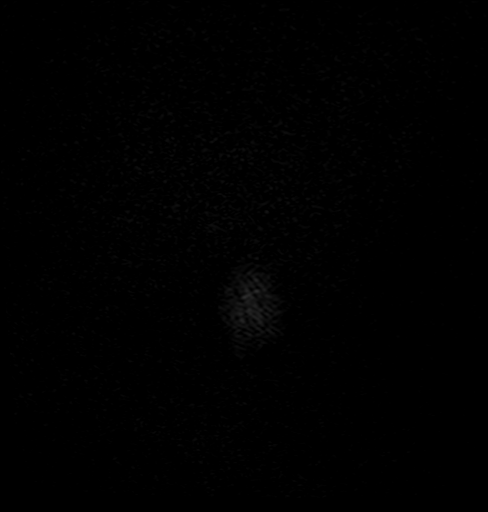

[Series 10: T2 · axial · 5.0mm · 0.65mm/px · z∈[-39,+120]mm · 2 of 29 slices shown (1 of 2)]
[im 1/29]
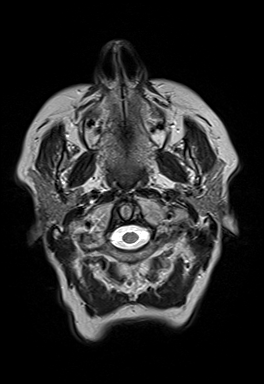
[im 29/29]
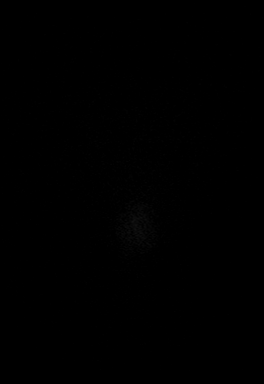

[Series 11: t1_mpr_tra · axial · 1.0mm · 0.72mm/px · z∈[-46,+119]mm · 9 of 176 slices shown]
[im 1/176]
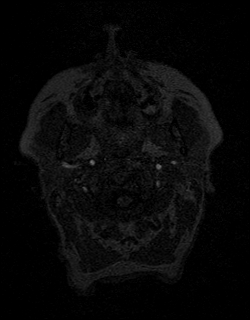
[im 22/176]
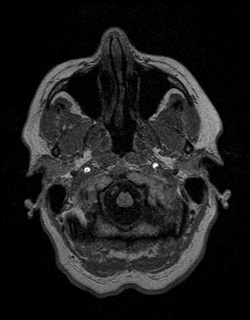
[im 44/176]
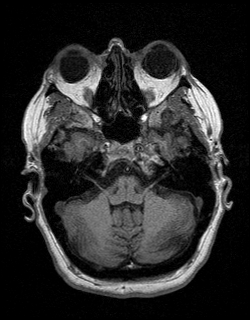
[im 66/176]
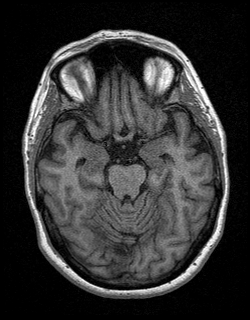
[im 88/176]
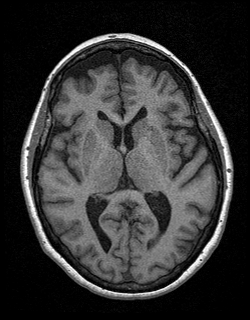
[im 110/176]
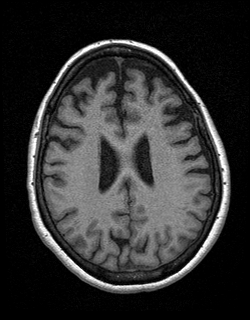
[im 132/176]
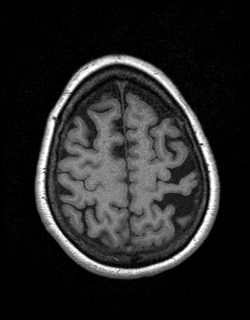
[im 154/176]
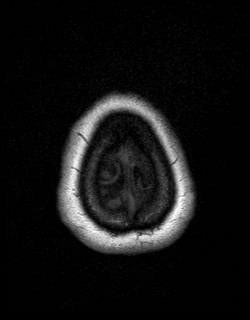
[im 176/176]
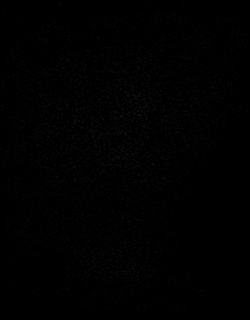

[Series 12: T2 · coronal · 5.0mm · 0.43mm/px · 2 of 32 slices shown (2 of 2)]
[im 1/32]
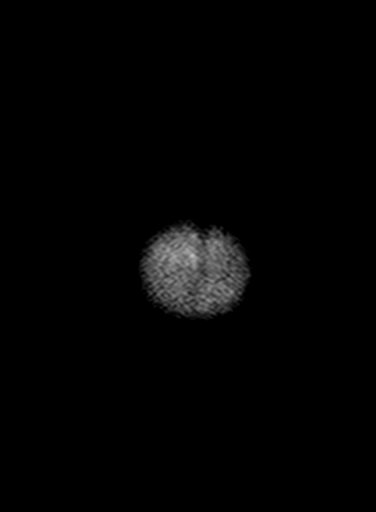
[im 32/32]
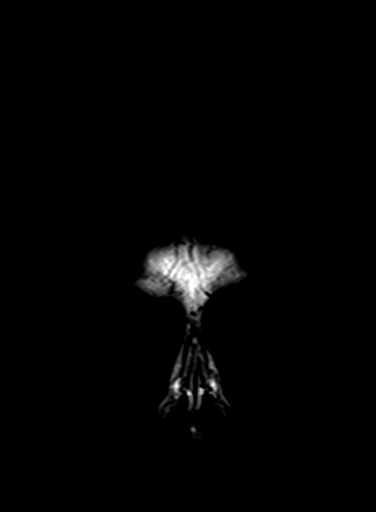

[Series 13: post t1_mpr_tra · axial · 1.0mm · 0.72mm/px · z∈[-46,+119]mm · 9 of 176 slices shown]
[im 1/176]
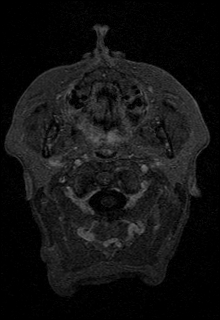
[im 22/176]
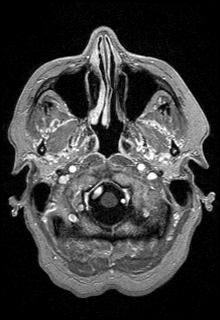
[im 44/176]
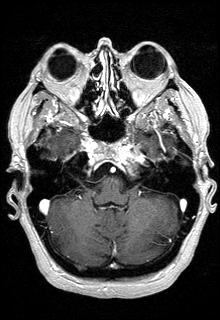
[im 66/176]
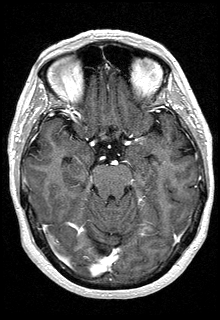
[im 88/176]
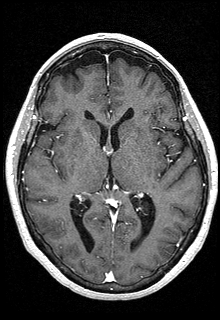
[im 110/176]
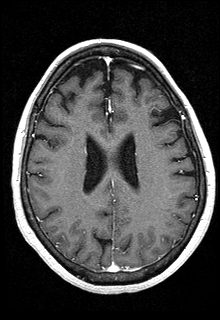
[im 132/176]
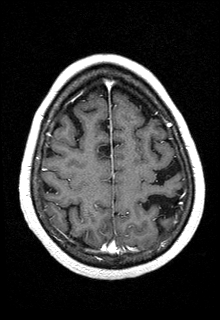
[im 154/176]
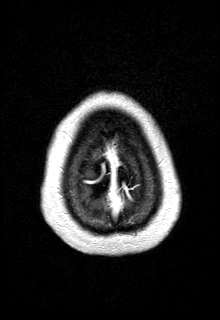
[im 176/176]
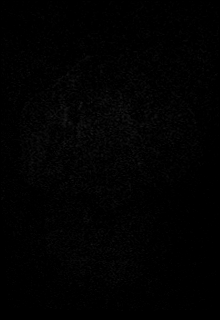

[Series 14: T1 post-contrast · coronal · 5.0mm · 0.72mm/px · 2 of 32 slices shown]
[im 1/32]
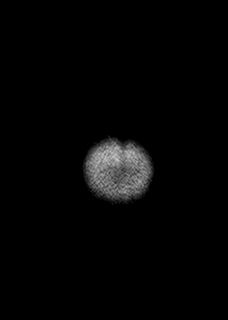
[im 32/32]
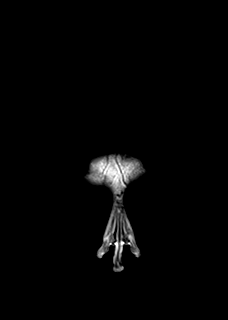

[48 of 48 positions shown; findings below may reference images not displayed]

FINDINGS: Brain: There is no evidence of an acute infarct, intracranial
hemorrhage, mass, midline shift, or extra-axial fluid collection.
There is moderate generalized cerebral atrophy. No significant white
matter disease is seen for age. There are dilated perivascular
spaces in the basal ganglia bilaterally. A small T2 hyperintense
focus at the posterior aspect of the left putamen/external capsule
may represent a dilated perivascular space or chronic lacunar
infarct. No abnormal enhancement is identified.

Vascular: Major intracranial vascular flow voids are preserved.

Skull and upper cervical spine: Unremarkable bone marrow signal.

Sinuses/Orbits: Bilateral cataract extraction and staphyloma. Left
scleral buckle. Paranasal sinuses and mastoid air cells are clear.

Other: None.
IMPRESSION: 1. No acute intracranial abnormality.
2. Cerebral Atrophy ([id number].[id number]).

## 2022-02-22 ENCOUNTER — Ambulatory Visit: Payer: Medicare Other | Admitting: Neurology

## 2022-03-24 ENCOUNTER — Telehealth: Payer: Self-pay | Admitting: Internal Medicine

## 2022-03-24 NOTE — Telephone Encounter (Signed)
CMN sent through [facility] it has been signed electrophonically

## 2022-03-24 NOTE — Telephone Encounter (Signed)
Called and spoke with patient.  Patient aware order was signed. Nothing further at this time.

## 2022-03-24 NOTE — Telephone Encounter (Signed)
Pt states CPAP supplies not ordered since 10 days ago per the home care co. Pls call [phone] to speak to a co rep if needed. I see no notes on her appt notes and looked under Meds for order but did not see. Thanks. [phone] is PT

## 2022-04-06 ENCOUNTER — Telehealth: Payer: Self-pay | Admitting: Internal Medicine

## 2022-04-06 NOTE — Telephone Encounter (Signed)
Looked at patients chart and she is with [facility]. Called and left voicemail for [place] with the cpap department and was notified that [facility] is closing down cpap department. I asked Katharine Look to call office back so we can get some information on patients cpap order and where it got sent to.

## 2022-04-12 NOTE — Telephone Encounter (Signed)
Spoke with Rodena Piety and she has signed off on order in [employer]. Contacted [facility] number provided and left voicemail for them to call back to discuss if there is anything on our end that needed to be completed. Also contacted the patient to update her that the order was signed off on [employer].

## 2022-04-12 NOTE — Telephone Encounter (Signed)
Patient states she spoke with Marzetta Board at [facility] and they are waiting on a CMN from [employer] (they do not accept faxes) to complete her CPAP order. Please advise. If there are questions, please call [facility] at [phone].

## 2022-04-12 NOTE — Telephone Encounter (Signed)
Hey ladies,  Did we ever receive an CMN for this patient from [facility]?   And patient states that [facility] does not have CPAP order.  Do I need to place  anew order since [facility] is closing cpap department and sent it to [facility]?  Please advise

## 2022-04-19 NOTE — Telephone Encounter (Signed)
I have spoke with Emily Kelley just to verify whether the order was to go to [place] or [place].  I had already signed off the CMN in [place] for her. She stated that she spoke with [facility] and the were mailing her Cpap supplies by postal mail  and if she doesn't get them she will call us back

## 2022-12-19 NOTE — Progress Notes (Signed)
Subjective:    Patient ID: Emily Kelley, female    DOB: 08/29/1950, 72 y.o.   MRN: [id number]  HPI F followed for asthma, allergic rhinitis, OSA, complicated by hypothyroidism, hx parathyroidectomy, NPSG 01/19/1999 [facility], [place]) AHI 14.95/hour, desaturation to 86%, body weight 194 pounds ---------------------------------------------------------------------------   11/21/21- 72 year old female never smoker followed for OSA, complicated by hx asthma, allergic rhinitis, urticaria,  hypothyroidism/ parathyroidectomy, Parkinson's,  CPAP auto 5-15/[facility] Download-compliance 67%. AHI 3.8/ hr Body weight today-172 lbs Covid vax-2 [employer]  Being eval by Neurology for abnormal gait and parkinson's. 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  This impacts her sleep.  She has also developed balance problems.  Uses CPAP every night.  Recording stopped as of August 4.  12/21/22- 72 year old female never smoker followed for OSA, complicated by hx asthma, allergic rhinitis, urticaria,  hypothyroidism/ parathyroidectomy,  CPAP auto 5-15/[facility]> [facility] Download-compliance 100%, AHI/ 4.7/hr. Body weight today-177 lbs Doing fine with CPAP. Download reviewed. 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 She is dealing with unsteady gait and has rolling walker. Parkinson's ruled out.  She will get flu vax later in Fall. Asthma control doing well- not needing inhalers  Review of Systems- see HPI   + = positive Constitutional:    no- night sweats, fevers, chills, fatigue, lassitude. HEENT:   No-  headaches, difficulty swallowing, tooth/dental problems, sore throat,        sneezing, itching, ear ache, nasal congestion, post nasal drip,  CV:  No-   chest pain, orthopnea, PND, swelling in lower extremities, anasarca,  dizziness, palpitations Resp: No-   shortness of breath with exertion or at rest.              No-   productive cough,  No  non-productive cough,  No-  coughing up of blood.              No-   change in color of mucus.  No- wheezing.   Skin: No rash GI:  No-   heartburn, indigestion, abdominal pain, nausea, vomiting,  GU:  MS:  No-   joint pain or swelling. Marland Kitchen Psych:  No- change in mood or affect. No depression or anxiety.  No memory loss.   Objective:   Physical Exam-  General- Alert, Oriented, Affect-appropriate, Distress- none acute, + rolling walker Skin- Clear Lymphadenopathy- none Head- atraumatic            Eyes- Gross vision intact, PERRLA, conjunctivae clear secretions..            Ears- Hearing, canals normal            Nose- clear, no-Septal dev, mucus, polyps, erosion, perforation             Throat- Mallampati III-IV , mucosa clear , drainage- none, tonsils- atrophic Neck- flexible , trachea midline, no stridor , thyroid nl, carotid no bruit Chest - symmetrical excursion , unlabored           Heart/CV- RRR , no murmur , no gallop  , no rub, nl s1 s2                           - JVD- none , edema- none, stasis changes- none, varices- none           Lung- clear to P&A, wheeze- none, cough- none , dullness-none, rub- none  Chest wall-  Abd-  Br/ Gen/ Rectal- Not done, not indicated Extrem- cyanosis- none, clubbing, none, atrophy- none, strength- nl Neuro- grossly intact to observation    Assessment & Plan:

## 2022-12-21 ENCOUNTER — Encounter: Payer: Self-pay | Admitting: Internal Medicine

## 2022-12-21 ENCOUNTER — Ambulatory Visit (INDEPENDENT_AMBULATORY_CARE_PROVIDER_SITE_OTHER): Payer: Medicare Other | Admitting: Internal Medicine

## 2022-12-21 VITALS — BP 124/62 | HR 84 | Temp 98.2°F | Ht 67.0 in | Wt 177.8 lb

## 2022-12-21 DIAGNOSIS — R2689 Other abnormalities of gait and mobility: Secondary | ICD-10-CM

## 2022-12-21 DIAGNOSIS — J3089 Other allergic rhinitis: Secondary | ICD-10-CM | POA: Diagnosis not present

## 2022-12-21 DIAGNOSIS — G4733 Obstructive sleep apnea (adult) (pediatric): Secondary | ICD-10-CM | POA: Diagnosis not present

## 2022-12-21 DIAGNOSIS — J302 Other seasonal allergic rhinitis: Secondary | ICD-10-CM | POA: Diagnosis not present

## 2022-12-21 NOTE — Patient Instructions (Signed)
I'm glad you are doing well. We can continue CPAP auto 5-15  Please call if we can help

## 2022-12-24 ENCOUNTER — Encounter: Payer: Self-pay | Admitting: Internal Medicine

## 2022-12-24 NOTE — Assessment & Plan Note (Signed)
Benefits from CPAP with good compliance and control Plan- continue auto 5-15

## 2022-12-24 NOTE — Assessment & Plan Note (Signed)
Using rolling walker

## 2022-12-24 NOTE — Assessment & Plan Note (Signed)
Minimal symptoms

## 2023-01-03 ENCOUNTER — Other Ambulatory Visit (HOSPITAL_COMMUNITY): Payer: Self-pay

## 2023-12-24 NOTE — Progress Notes (Signed)
 Subjective:    Patient ID: Emily Kelley, female    DOB: 15-Nov-1950, 73 y.o.   MRN: [id number]  HPI F followed for asthma, allergic rhinitis, OSA, complicated by hypothyroidism, hx parathyroidectomy, NPSG 01/19/1999 ([place], [place] ) AHI 14.95/hour, desaturation to 86%, body weight 194 pounds ---------------------------------------------------------------------------   12/21/22- 73 year old female never smoker followed for OSA, complicated by hx asthma, allergic rhinitis, urticaria,  hypothyroidism/ parathyroidectomy,  CPAP auto 5-15/[facility]> [facility] Download-compliance 100%, AHI/ 4.7/hr. Body weight today-177 lbs Doing fine with CPAP. Download reviewed. 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 She is dealing with unsteady gait and has rolling walker. Parkinson's ruled out.  She will get flu vax later in Fall. Asthma control doing well- not needing inhalers  12/25/23- 73 year old female never smoker followed for OSA, complicated by hx asthma, allergic rhinitis, urticaria,  hypothyroidism/ parathyroidectomy,  CPAP auto 5-15/[facility]> [facility] Download-compliance 100%, AHI 6.1/hr Body weight today-174 lbs Discussed the use of AI scribe software for clinical note transcription with the patient, who gave verbal consent to proceed.  History of Present Illness   Emily Kelley is a 73 year old female with sleep apnea who presents for routine follow-up.  She uses her CPAP machine consistently every night, including during naps, as she cannot lay flat without it. The machine functions well, and she feels comfortable using it. There is a slight mask leak, but it is not significantly bothersome. She uses a nasal pillow with a strap around her head. Her apnea episodes per hour are around six, slightly increased from five previously. She sleeps comfortably and does not experience daytime sleepiness. She experienced a runny nose for a day due to a recent pollen flurry,  which she attributes to the time of year. She has not had any other problems and does not require any medication refills.     Assessment and Plan:    Obstructive sleep apnea Obstructive sleep apnea well-managed with CPAP. Slight mask leak noted but not problematic. Six episodes per hour, within acceptable range. No daytime sleepiness reported. - Continue current CPAP therapy.  Seasonal allergic rhinitis Symptoms consistent with seasonal allergic rhinitis, likely due to ragweed. Symptoms resolved, no medication needed currently.     Review of Systems- see HPI   + = positive Constitutional:    no- night sweats, fevers, chills, fatigue, lassitude. HEENT:   No-  headaches, difficulty swallowing, tooth/dental problems, sore throat,        sneezing, itching, ear ache, nasal congestion, post nasal drip,  CV:  No-   chest pain, orthopnea, PND, swelling in lower extremities, anasarca,  dizziness, palpitations Resp: No-   shortness of breath with exertion or at rest.              No-   productive cough,  No non-productive cough,  No-  coughing up of blood.              No-   change in color of mucus.  No- wheezing.   Skin: No rash GI:  No-   heartburn, indigestion, abdominal pain, nausea, vomiting,  GU:  MS:  No-   joint pain or swelling. SABRA Psych:  No- change in mood or affect. No depression or anxiety.  No memory loss.   Objective:   Physical Exam-  General- Alert, Oriented, Affect-appropriate, Distress- none acute, + rolling walker Skin- Clear Lymphadenopathy- none Head- atraumatic            Eyes- Gross vision intact, PERRLA, conjunctivae clear secretions.SABRA  Ears- Hearing, canals normal            Nose- clear, no-Septal dev, mucus, polyps, erosion, perforation             Throat- Mallampati III-IV , mucosa clear , drainage- none, tonsils- atrophic Neck- flexible , trachea midline, no stridor , thyroid  nl, carotid no bruit Chest - symmetrical excursion , unlabored            Heart/CV- RRR , no murmur , no gallop  , no rub, nl s1 s2                           - JVD- none , edema- none, stasis changes- none, varices- none           Lung- clear to P&A, wheeze- none, cough- none , dullness-none, rub- none           Chest wall-  Abd-  Br/ Gen/ Rectal- Not done, not indicated Extrem- cyanosis- none, clubbing, none, atrophy- none, strength- nl Neuro- grossly intact to observation    Assessment & Plan:

## 2023-12-25 ENCOUNTER — Ambulatory Visit (INDEPENDENT_AMBULATORY_CARE_PROVIDER_SITE_OTHER): Admitting: Internal Medicine

## 2023-12-25 ENCOUNTER — Encounter: Payer: Self-pay | Admitting: Internal Medicine

## 2023-12-25 VITALS — BP 124/64 | HR 72 | Temp 97.7°F | Ht 67.0 in | Wt 174.6 lb

## 2023-12-25 DIAGNOSIS — G4733 Obstructive sleep apnea (adult) (pediatric): Secondary | ICD-10-CM | POA: Diagnosis not present

## 2023-12-25 DIAGNOSIS — J302 Other seasonal allergic rhinitis: Secondary | ICD-10-CM

## 2023-12-25 NOTE — Patient Instructions (Signed)
 You are doing fine- we can continue CPAP as you are doing.

## 2023-12-29 ENCOUNTER — Encounter: Payer: Self-pay | Admitting: Internal Medicine
